# Patient Record
Sex: Male | Born: 1960 | Race: White | Hispanic: No | Marital: Married | Smoking: Current every day smoker
Health system: Southern US, Community
[De-identification: ages and names within clinical notes are randomized; demographics above are authoritative.]

---

## 2020-07-24 DIAGNOSIS — H524 Presbyopia: Secondary | ICD-10-CM | POA: Diagnosis not present

## 2021-09-13 DIAGNOSIS — R5383 Other fatigue: Secondary | ICD-10-CM | POA: Diagnosis not present

## 2021-09-30 ENCOUNTER — Other Ambulatory Visit: Payer: Self-pay | Admitting: Family Medicine

## 2021-09-30 ENCOUNTER — Ambulatory Visit
Admission: RE | Admit: 2021-09-30 | Discharge: 2021-09-30 | Disposition: A | Discharge status: Home / Self Care | Payer: Self-pay | Source: Ambulatory Visit | Attending: Family Medicine | Admitting: Family Medicine

## 2021-09-30 DIAGNOSIS — R61 Generalized hyperhidrosis: Secondary | ICD-10-CM | POA: Diagnosis not present

## 2021-09-30 DIAGNOSIS — R059 Cough, unspecified: Secondary | ICD-10-CM | POA: Diagnosis not present

## 2021-09-30 DIAGNOSIS — R634 Abnormal weight loss: Secondary | ICD-10-CM | POA: Diagnosis not present

## 2021-09-30 DIAGNOSIS — R5383 Other fatigue: Secondary | ICD-10-CM | POA: Diagnosis not present

## 2021-09-30 IMAGING — CR DG CHEST 2V
2 series · 2 of 2 positions shown · non-contrast
Comparison: [DATE]

CLINICAL DATA: 60-year-old male with cough

EXAM:
CHEST - 2 VIEW

[w chest pa]
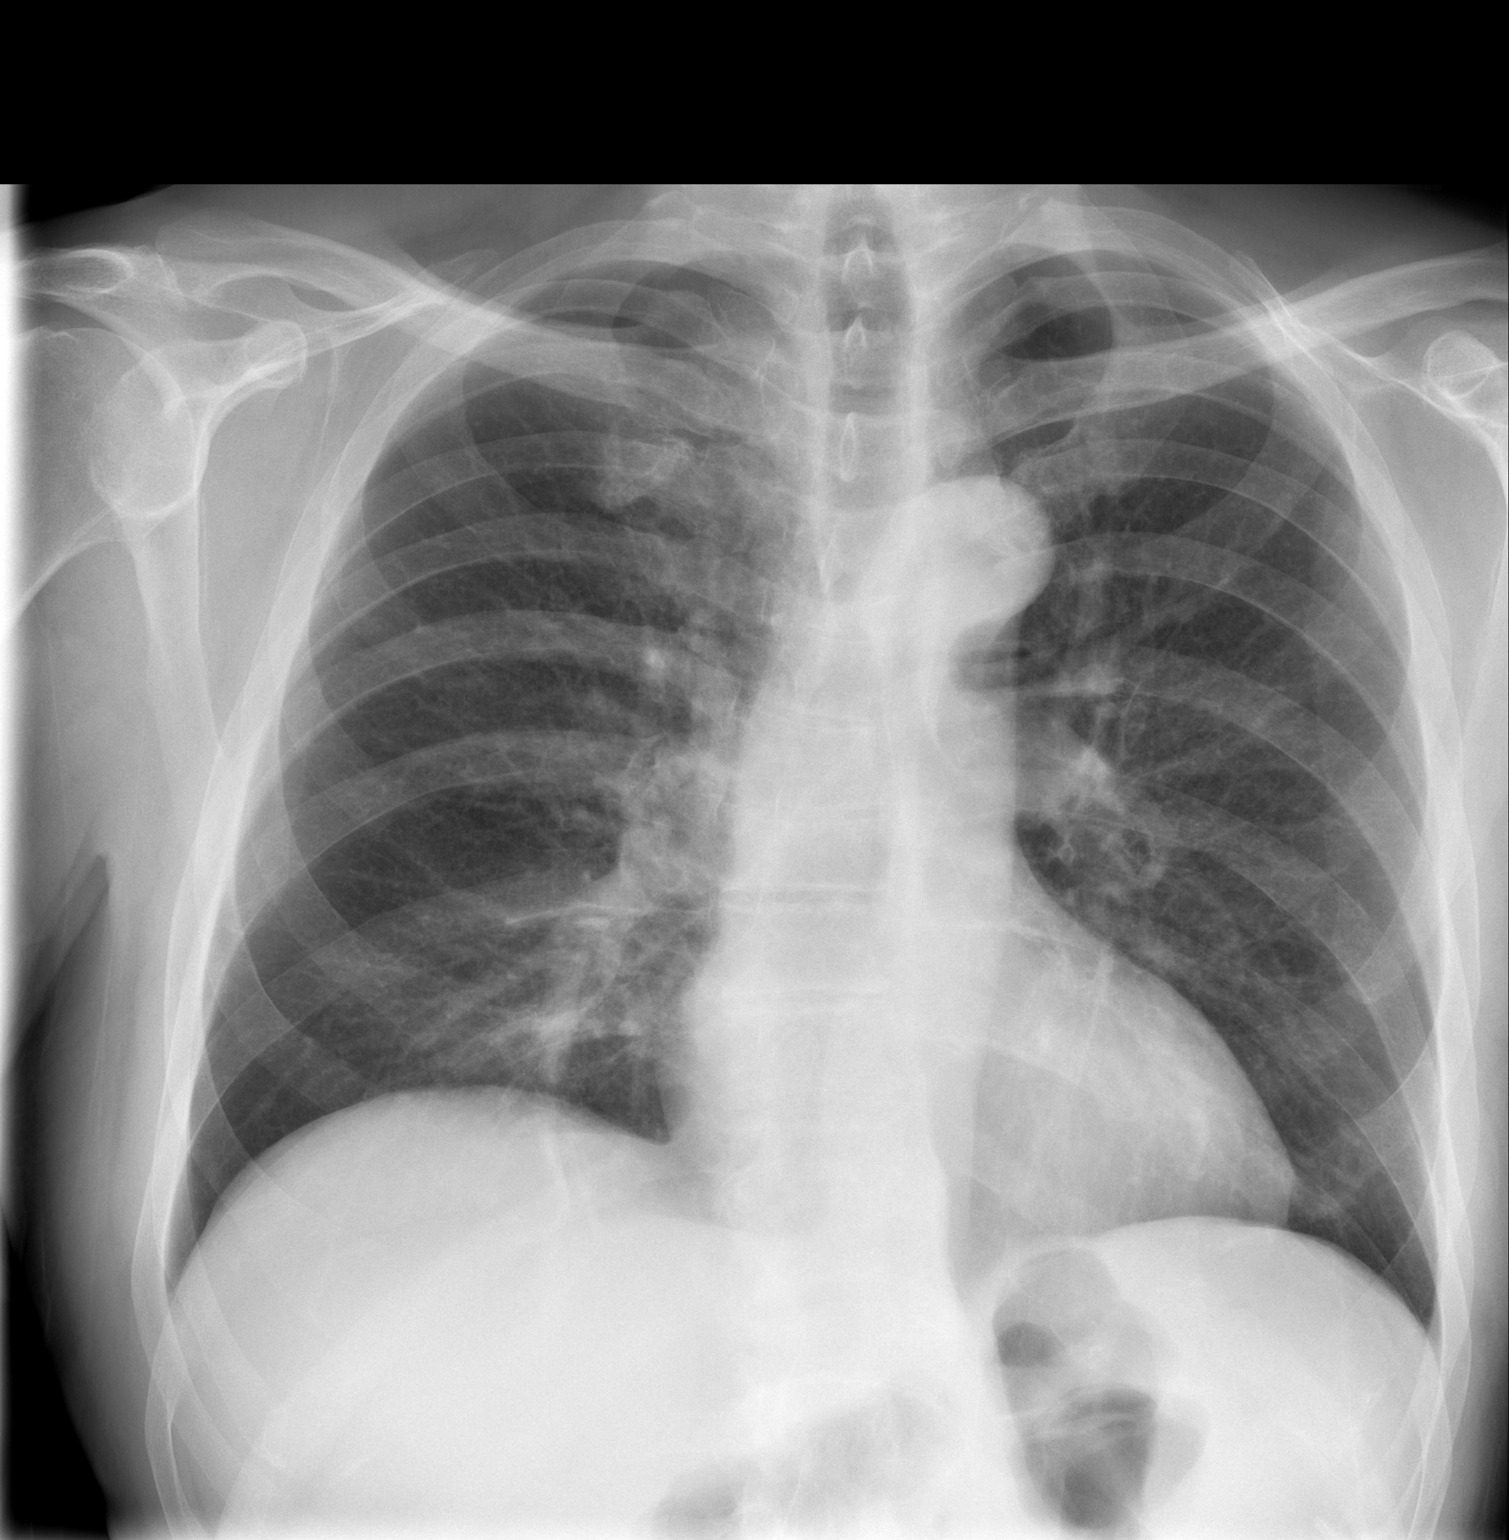

[w chest lat]
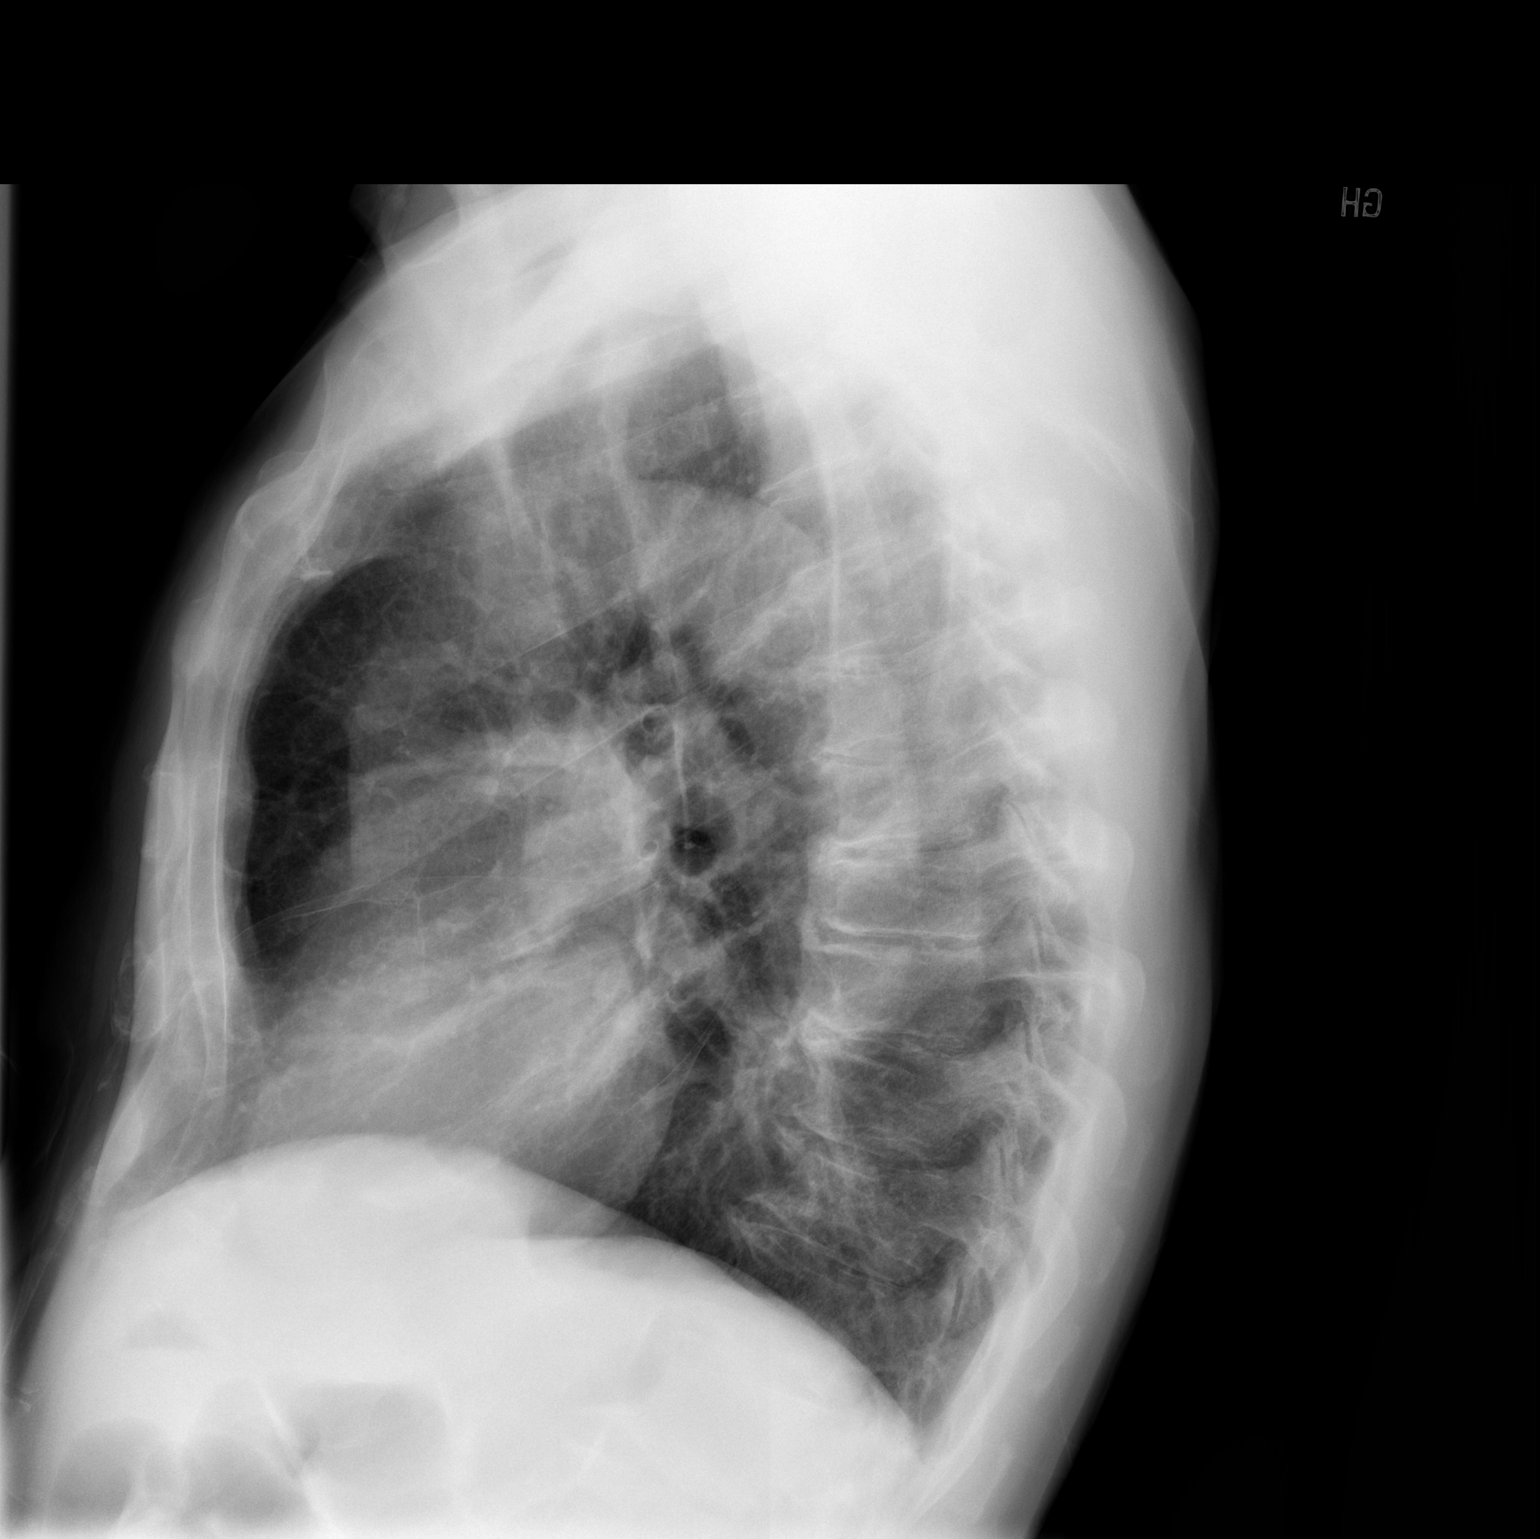

[2 of 2 positions shown; findings below may reference images not displayed]

FINDINGS: Cardiomediastinal silhouette unchanged in size and contour. No
evidence of central vascular congestion. No interlobular septal
thickening.

Mild bronchial wall thickening. Mild reticular opacities in the
infrahilar regions greater on the right.

No pneumothorax or pleural effusion. Coarsened interstitial
markings, with no confluent airspace disease.

No acute displaced fracture. Degenerative changes of the spine.

Scoliotic curvature of the spine.
IMPRESSION: Favored chronic lung changes without evidence of acute
cardiopulmonary disease. Note that low-dose CT lung cancer screening
is recommended for patients who are 55-80 years of age with a 30+
pack-year history of smoking, and who are currently smoking or quit
<=15 years ago.

## 2021-10-05 DIAGNOSIS — N19 Unspecified kidney failure: Secondary | ICD-10-CM | POA: Diagnosis not present

## 2021-10-08 ENCOUNTER — Inpatient Hospital Stay (HOSPITAL_COMMUNITY)
Admission: EM | Admit: 2021-10-08 | Discharge: 2021-10-20 | DRG: 064 | Disposition: E | Payer: BC Managed Care – PPO | Attending: Pulmonary Disease | Admitting: Pulmonary Disease

## 2021-10-08 ENCOUNTER — Emergency Department (HOSPITAL_COMMUNITY): Payer: BC Managed Care – PPO

## 2021-10-08 ENCOUNTER — Encounter (HOSPITAL_COMMUNITY): Payer: Self-pay

## 2021-10-08 ENCOUNTER — Other Ambulatory Visit: Payer: Self-pay | Admitting: Family Medicine

## 2021-10-08 ENCOUNTER — Observation Stay (HOSPITAL_COMMUNITY): Payer: BC Managed Care – PPO

## 2021-10-08 DIAGNOSIS — R29703 NIHSS score 3: Secondary | ICD-10-CM | POA: Diagnosis not present

## 2021-10-08 DIAGNOSIS — N3289 Other specified disorders of bladder: Secondary | ICD-10-CM | POA: Diagnosis not present

## 2021-10-08 DIAGNOSIS — Z20822 Contact with and (suspected) exposure to covid-19: Secondary | ICD-10-CM | POA: Diagnosis not present

## 2021-10-08 DIAGNOSIS — E871 Hypo-osmolality and hyponatremia: Secondary | ICD-10-CM | POA: Diagnosis present

## 2021-10-08 DIAGNOSIS — R319 Hematuria, unspecified: Secondary | ICD-10-CM

## 2021-10-08 DIAGNOSIS — R4182 Altered mental status, unspecified: Secondary | ICD-10-CM | POA: Diagnosis not present

## 2021-10-08 DIAGNOSIS — R4781 Slurred speech: Secondary | ICD-10-CM | POA: Diagnosis present

## 2021-10-08 DIAGNOSIS — R29725 NIHSS score 25: Secondary | ICD-10-CM | POA: Diagnosis not present

## 2021-10-08 DIAGNOSIS — G8191 Hemiplegia, unspecified affecting right dominant side: Secondary | ICD-10-CM | POA: Diagnosis not present

## 2021-10-08 DIAGNOSIS — N138 Other obstructive and reflux uropathy: Secondary | ICD-10-CM | POA: Diagnosis not present

## 2021-10-08 DIAGNOSIS — I63442 Cerebral infarction due to embolism of left cerebellar artery: Principal | ICD-10-CM | POA: Diagnosis present

## 2021-10-08 DIAGNOSIS — D649 Anemia, unspecified: Secondary | ICD-10-CM | POA: Insufficient documentation

## 2021-10-08 DIAGNOSIS — R338 Other retention of urine: Secondary | ICD-10-CM | POA: Diagnosis present

## 2021-10-08 DIAGNOSIS — N2889 Other specified disorders of kidney and ureter: Secondary | ICD-10-CM | POA: Diagnosis not present

## 2021-10-08 DIAGNOSIS — N401 Enlarged prostate with lower urinary tract symptoms: Secondary | ICD-10-CM | POA: Diagnosis present

## 2021-10-08 DIAGNOSIS — R29714 NIHSS score 14: Secondary | ICD-10-CM | POA: Diagnosis not present

## 2021-10-08 DIAGNOSIS — I330 Acute and subacute infective endocarditis: Secondary | ICD-10-CM | POA: Diagnosis not present

## 2021-10-08 DIAGNOSIS — D72829 Elevated white blood cell count, unspecified: Secondary | ICD-10-CM | POA: Diagnosis present

## 2021-10-08 DIAGNOSIS — G9341 Metabolic encephalopathy: Secondary | ICD-10-CM | POA: Diagnosis not present

## 2021-10-08 DIAGNOSIS — F17210 Nicotine dependence, cigarettes, uncomplicated: Secondary | ICD-10-CM | POA: Diagnosis present

## 2021-10-08 DIAGNOSIS — N280 Ischemia and infarction of kidney: Secondary | ICD-10-CM | POA: Diagnosis present

## 2021-10-08 DIAGNOSIS — I639 Cerebral infarction, unspecified: Secondary | ICD-10-CM | POA: Diagnosis not present

## 2021-10-08 DIAGNOSIS — R9431 Abnormal electrocardiogram [ECG] [EKG]: Secondary | ICD-10-CM | POA: Diagnosis not present

## 2021-10-08 DIAGNOSIS — I619 Nontraumatic intracerebral hemorrhage, unspecified: Secondary | ICD-10-CM | POA: Diagnosis not present

## 2021-10-08 DIAGNOSIS — I6340 Cerebral infarction due to embolism of unspecified cerebral artery: Secondary | ICD-10-CM | POA: Diagnosis not present

## 2021-10-08 DIAGNOSIS — G9340 Encephalopathy, unspecified: Secondary | ICD-10-CM | POA: Diagnosis not present

## 2021-10-08 DIAGNOSIS — Z8673 Personal history of transient ischemic attack (TIA), and cerebral infarction without residual deficits: Secondary | ICD-10-CM | POA: Diagnosis not present

## 2021-10-08 DIAGNOSIS — R29818 Other symptoms and signs involving the nervous system: Secondary | ICD-10-CM | POA: Diagnosis not present

## 2021-10-08 DIAGNOSIS — I6389 Other cerebral infarction: Secondary | ICD-10-CM | POA: Diagnosis not present

## 2021-10-08 DIAGNOSIS — Z79899 Other long term (current) drug therapy: Secondary | ICD-10-CM

## 2021-10-08 DIAGNOSIS — Z515 Encounter for palliative care: Secondary | ICD-10-CM

## 2021-10-08 DIAGNOSIS — J9600 Acute respiratory failure, unspecified whether with hypoxia or hypercapnia: Secondary | ICD-10-CM | POA: Diagnosis not present

## 2021-10-08 DIAGNOSIS — D509 Iron deficiency anemia, unspecified: Secondary | ICD-10-CM | POA: Diagnosis present

## 2021-10-08 DIAGNOSIS — R29717 NIHSS score 17: Secondary | ICD-10-CM | POA: Diagnosis not present

## 2021-10-08 DIAGNOSIS — K4020 Bilateral inguinal hernia, without obstruction or gangrene, not specified as recurrent: Secondary | ICD-10-CM | POA: Diagnosis not present

## 2021-10-08 DIAGNOSIS — E785 Hyperlipidemia, unspecified: Secondary | ICD-10-CM | POA: Diagnosis present

## 2021-10-08 DIAGNOSIS — R531 Weakness: Secondary | ICD-10-CM | POA: Diagnosis not present

## 2021-10-08 DIAGNOSIS — Z4682 Encounter for fitting and adjustment of non-vascular catheter: Secondary | ICD-10-CM | POA: Diagnosis not present

## 2021-10-08 DIAGNOSIS — I340 Nonrheumatic mitral (valve) insufficiency: Secondary | ICD-10-CM | POA: Diagnosis not present

## 2021-10-08 DIAGNOSIS — R29738 NIHSS score 38: Secondary | ICD-10-CM | POA: Diagnosis not present

## 2021-10-08 DIAGNOSIS — I249 Acute ischemic heart disease, unspecified: Secondary | ICD-10-CM

## 2021-10-08 DIAGNOSIS — E876 Hypokalemia: Secondary | ICD-10-CM | POA: Diagnosis present

## 2021-10-08 DIAGNOSIS — R3129 Other microscopic hematuria: Secondary | ICD-10-CM | POA: Diagnosis present

## 2021-10-08 DIAGNOSIS — Z960 Presence of urogenital implants: Secondary | ICD-10-CM | POA: Diagnosis not present

## 2021-10-08 DIAGNOSIS — I469 Cardiac arrest, cause unspecified: Secondary | ICD-10-CM | POA: Diagnosis not present

## 2021-10-08 DIAGNOSIS — I609 Nontraumatic subarachnoid hemorrhage, unspecified: Secondary | ICD-10-CM | POA: Diagnosis not present

## 2021-10-08 DIAGNOSIS — R1310 Dysphagia, unspecified: Secondary | ICD-10-CM | POA: Diagnosis not present

## 2021-10-08 DIAGNOSIS — I700 Atherosclerosis of aorta: Secondary | ICD-10-CM | POA: Diagnosis not present

## 2021-10-08 DIAGNOSIS — J9811 Atelectasis: Secondary | ICD-10-CM | POA: Diagnosis not present

## 2021-10-08 DIAGNOSIS — I2510 Atherosclerotic heart disease of native coronary artery without angina pectoris: Secondary | ICD-10-CM | POA: Diagnosis not present

## 2021-10-08 DIAGNOSIS — R634 Abnormal weight loss: Secondary | ICD-10-CM

## 2021-10-08 LAB — CBC
HCT: 30 % — ABNORMAL LOW (ref 39.0–52.0)
Hemoglobin: 9.9 g/dL — ABNORMAL LOW (ref 13.0–17.0)
MCH: 29.8 pg (ref 26.0–34.0)
MCHC: 33 g/dL (ref 30.0–36.0)
MCV: 90.4 fL (ref 80.0–100.0)
Platelets: 143 10*3/uL — ABNORMAL LOW (ref 150–400)
RBC: 3.32 MIL/uL — ABNORMAL LOW (ref 4.22–5.81)
RDW: 13.5 % (ref 11.5–15.5)
WBC: 11.5 10*3/uL — ABNORMAL HIGH (ref 4.0–10.5)
nRBC: 0 % (ref 0.0–0.2)

## 2021-10-08 LAB — DIFFERENTIAL
Abs Immature Granulocytes: 0.14 10*3/uL — ABNORMAL HIGH (ref 0.00–0.07)
Basophils Absolute: 0 10*3/uL (ref 0.0–0.1)
Basophils Relative: 0 %
Eosinophils Absolute: 0 10*3/uL (ref 0.0–0.5)
Eosinophils Relative: 0 %
Immature Granulocytes: 1 %
Lymphocytes Relative: 8 %
Lymphs Abs: 1 10*3/uL (ref 0.7–4.0)
Monocytes Absolute: 0.8 10*3/uL (ref 0.1–1.0)
Monocytes Relative: 7 %
Neutro Abs: 9.7 10*3/uL — ABNORMAL HIGH (ref 1.7–7.7)
Neutrophils Relative %: 84 %

## 2021-10-08 LAB — I-STAT CHEM 8, ED
BUN: 11 mg/dL (ref 8–23)
Calcium, Ion: 1.11 mmol/L — ABNORMAL LOW (ref 1.15–1.40)
Chloride: 98 mmol/L (ref 98–111)
Creatinine, Ser: 0.8 mg/dL (ref 0.61–1.24)
Glucose, Bld: 109 mg/dL — ABNORMAL HIGH (ref 70–99)
HCT: 28 % — ABNORMAL LOW (ref 39.0–52.0)
Hemoglobin: 9.5 g/dL — ABNORMAL LOW (ref 13.0–17.0)
Potassium: 3.5 mmol/L (ref 3.5–5.1)
Sodium: 133 mmol/L — ABNORMAL LOW (ref 135–145)
TCO2: 25 mmol/L (ref 22–32)

## 2021-10-08 LAB — COMPREHENSIVE METABOLIC PANEL
ALT: 17 U/L (ref 0–44)
AST: 19 U/L (ref 15–41)
Albumin: 2.6 g/dL — ABNORMAL LOW (ref 3.5–5.0)
Alkaline Phosphatase: 75 U/L (ref 38–126)
Anion gap: 9 (ref 5–15)
BUN: 12 mg/dL (ref 8–23)
CO2: 23 mmol/L (ref 22–32)
Calcium: 8.3 mg/dL — ABNORMAL LOW (ref 8.9–10.3)
Chloride: 99 mmol/L (ref 98–111)
Creatinine, Ser: 0.9 mg/dL (ref 0.61–1.24)
GFR, Estimated: >60 mL/min (ref >60)
Glucose, Bld: 115 mg/dL — ABNORMAL HIGH (ref 70–99)
Potassium: 3.5 mmol/L (ref 3.5–5.1)
Sodium: 131 mmol/L — ABNORMAL LOW (ref 135–145)
Total Bilirubin: 0.8 mg/dL (ref 0.3–1.2)
Total Protein: 5.6 g/dL — ABNORMAL LOW (ref 6.5–8.1)

## 2021-10-08 LAB — URINALYSIS, ROUTINE W REFLEX MICROSCOPIC
Bacteria, UA: NONE SEEN
Bilirubin Urine: NEGATIVE
Glucose, UA: NEGATIVE mg/dL
Ketones, ur: NEGATIVE mg/dL
Leukocytes,Ua: NEGATIVE
Nitrite: NEGATIVE
Protein, ur: NEGATIVE mg/dL
Specific Gravity, Urine: 1.012 (ref 1.005–1.030)
pH: 6 (ref 5.0–8.0)

## 2021-10-08 LAB — RAPID URINE DRUG SCREEN, HOSP PERFORMED
Amphetamines: NOT DETECTED
Barbiturates: NOT DETECTED
Benzodiazepines: NOT DETECTED
Cocaine: NOT DETECTED
Opiates: NOT DETECTED
Tetrahydrocannabinol: NOT DETECTED

## 2021-10-08 LAB — PROTIME-INR
INR: 1.2 (ref 0.8–1.2)
Prothrombin Time: 14.8 seconds (ref 11.4–15.2)

## 2021-10-08 LAB — ETHANOL: Alcohol, Ethyl (B): <10 mg/dL (ref <10)

## 2021-10-08 LAB — APTT: aPTT: 48 seconds — ABNORMAL HIGH (ref 24–36)

## 2021-10-08 LAB — RESP PANEL BY RT-PCR (FLU A&B, COVID) ARPGX2
Influenza A by PCR: NEGATIVE
Influenza B by PCR: NEGATIVE
SARS Coronavirus 2 by RT PCR: NEGATIVE

## 2021-10-08 LAB — CBG MONITORING, ED: Glucose-Capillary: 113 mg/dL — ABNORMAL HIGH (ref 70–99)

## 2021-10-08 IMAGING — CT CT ABD-PELV W/O CM
2 of 4 series · 15 of 46 positions shown, 17 images · non-contrast
Comparison: None.

CLINICAL DATA: Hematuria, unknown cause

EXAM:
CT ABDOMEN AND PELVIS WITHOUT CONTRAST
TECHNIQUE: Multidetector CT imaging of the abdomen and pelvis was performed
following the standard protocol without IV contrast.

[Series 3: abd/ pelvis 5.0 i30f 2 · axial · 0.77mm/px · z∈[+577,+1042]mm · 12 of 103 slices shown, 14 images]
[im 5/103  soft-tissue]
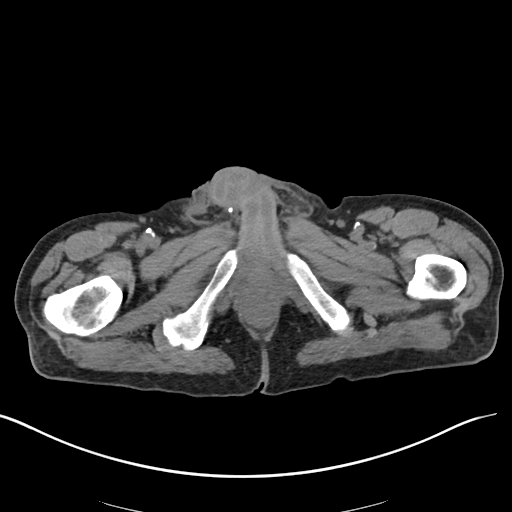
[im 5/103  bone]
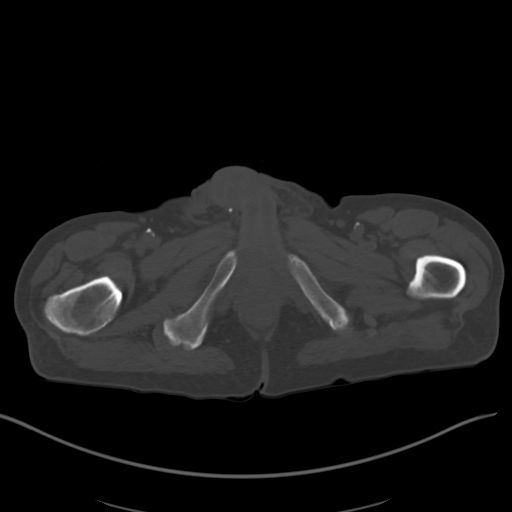
[im 14/103  soft-tissue]
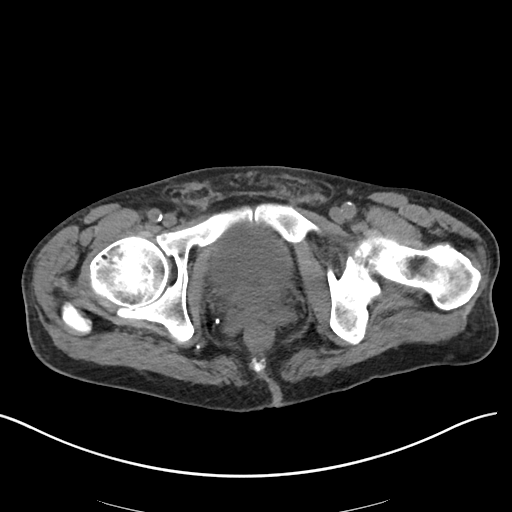
[im 24/103  soft-tissue]
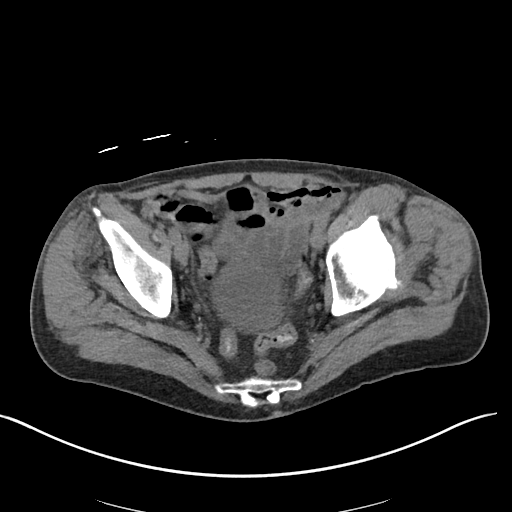
[im 33/103  soft-tissue]
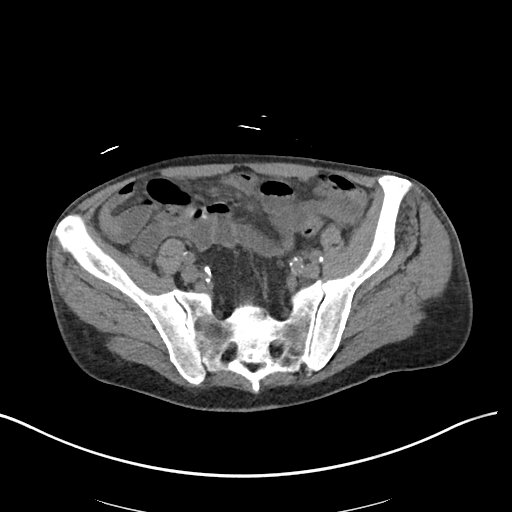
[im 38/103  soft-tissue]
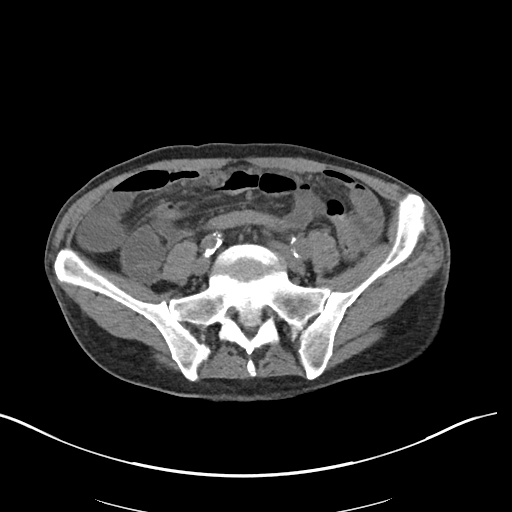
[im 47/103  soft-tissue]
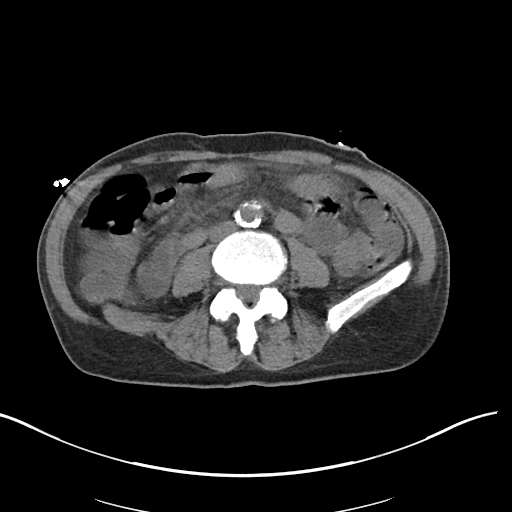
[im 56/103  soft-tissue]
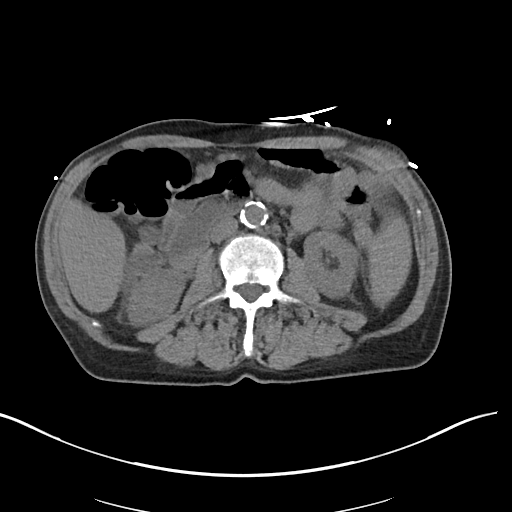
[im 65/103  soft-tissue]
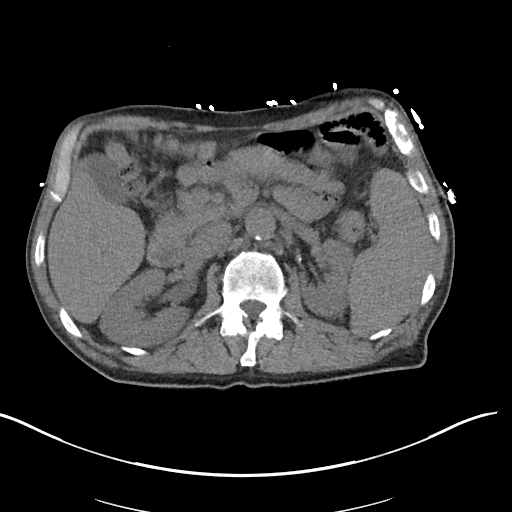
[im 70/103  soft-tissue]
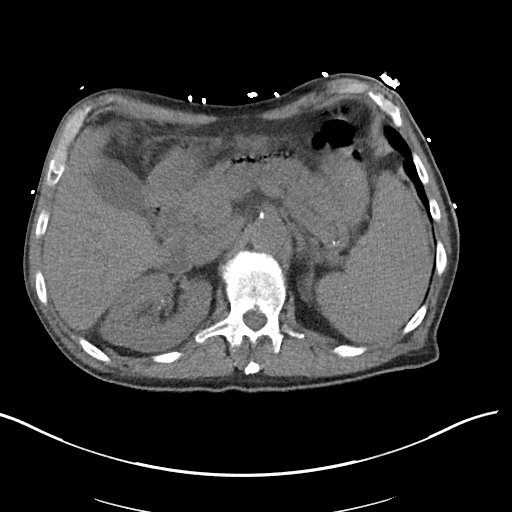
[im 70/103  bone]
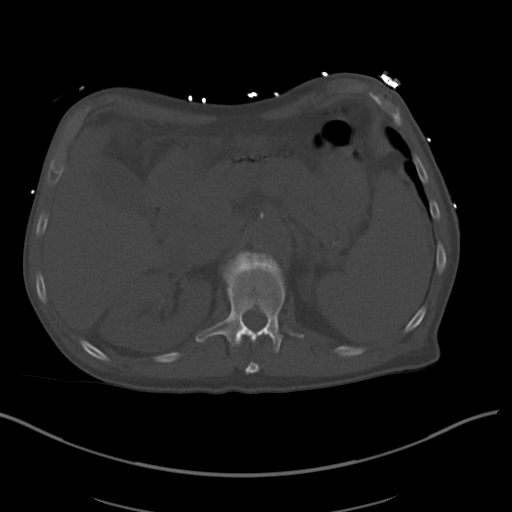
[im 79/103  soft-tissue]
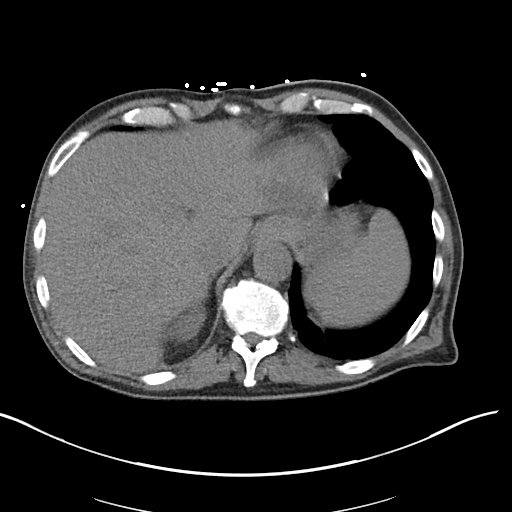
[im 89/103  soft-tissue]
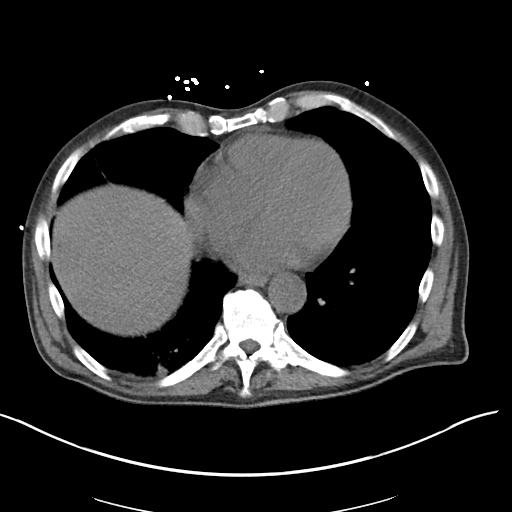
[im 98/103  soft-tissue]
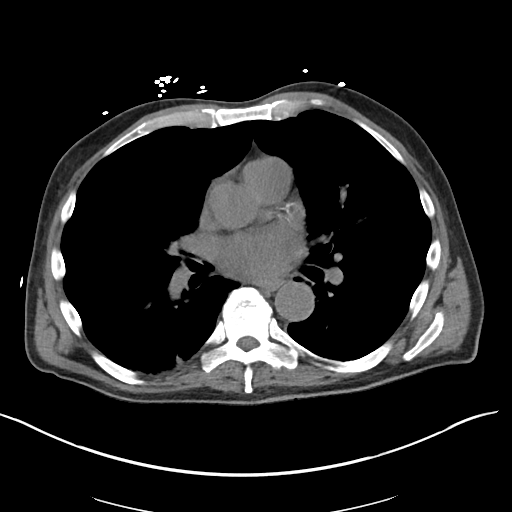

[Series 6: cor st · coronal · 0.73mm/px · 3 of 83 slices shown]
[im 28/83  soft-tissue]
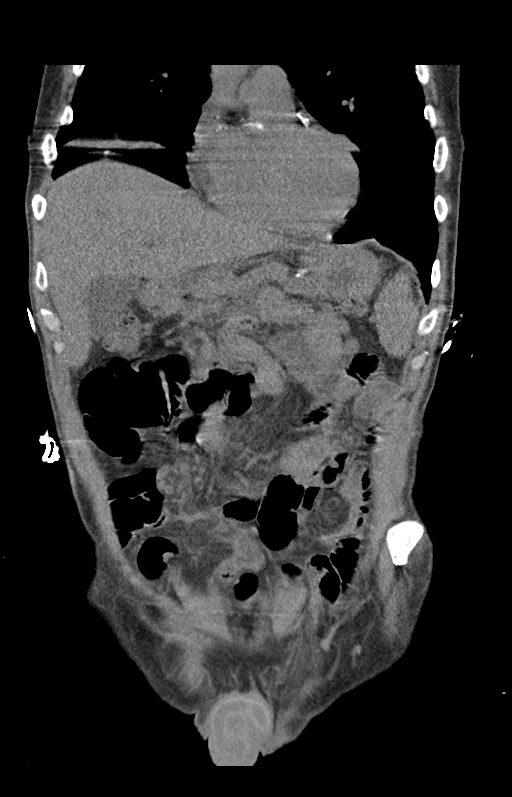
[im 37/83  soft-tissue]
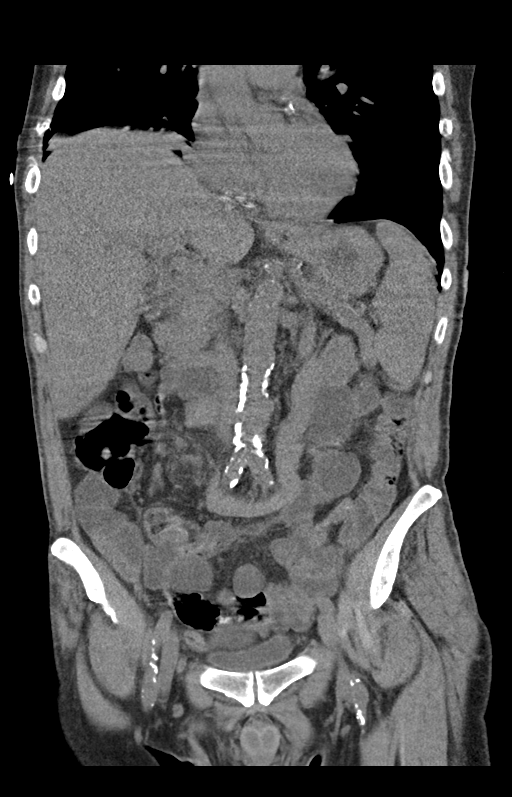
[im 46/83  soft-tissue]
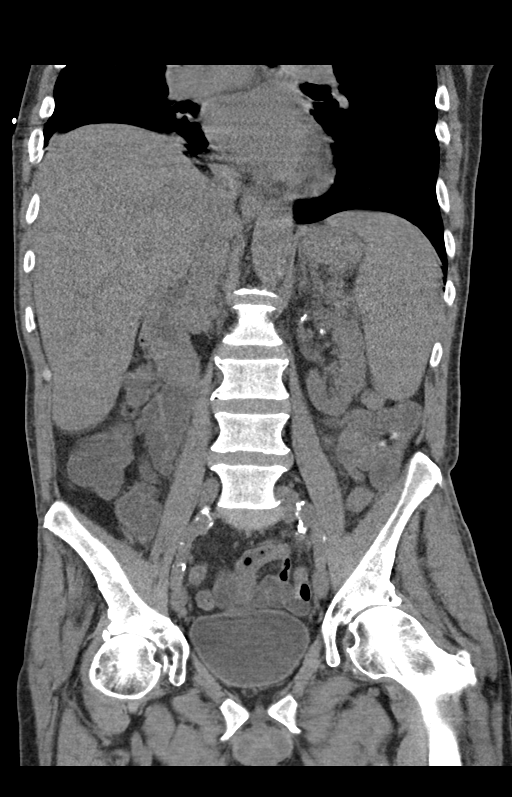

[15 of 46 positions shown; findings below may reference images not displayed]

FINDINGS: Lower chest: Lung bases are clear. Small mucoid material in the
RIGHT mainstem bronchus. Linear pleuroparenchymal thickening at the
RIGHT lung base suggests atelectasis.

Hepatobiliary: No focal hepatic lesion. No biliary duct dilatation.
Common bile duct is normal.

Pancreas: Pancreas is normal. No ductal dilatation. No pancreatic
inflammation.

Spleen: Normal spleen

Adrenals/urinary tract: Adrenal glands are normal. No
nephrolithiasis or ureterolithiasis. No obstructive uropathy.
Vascular calcifications in the renal hila. No bladder calculi.

Stomach/Bowel: Stomach, small-bowel and cecum are normal. The
appendix is not identified but there is no pericecal inflammation to
suggest appendicitis. The colon and rectosigmoid colon are normal.

Vascular/Lymphatic: Abdominal aorta is normal caliber. No periportal
or retroperitoneal adenopathy. No pelvic adenopathy.

Reproductive: Prostate unremarkable

Other: No free fluid.

Musculoskeletal: No aggressive osseous lesion.
IMPRESSION: 1. No nephrolithiasis, ureterolithiasis or obstructive uropathy.
2.  Aortic Atherosclerosis ([JT]-[JT]).

## 2021-10-08 IMAGING — MR MR HEAD W/O CM
6 of 11 series · 24 of 48 positions shown · non-contrast
Comparison: None.

CLINICAL DATA: Neuro deficit, acute, stroke suspected

EXAM:
MRI HEAD WITHOUT CONTRAST
TECHNIQUE: Multiplanar, multiecho pulse sequences of the brain and surrounding
structures were obtained without intravenous contrast.

[Series 2: DWI · axial · 3.0mm · 0.94mm/px · z∈[-76,+79]mm · 7 of 108 slices shown (1 of 2)]
[im 1/108]
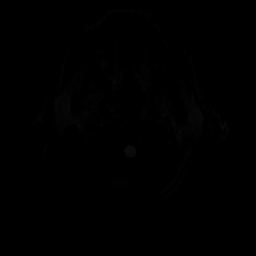
[im 18/108]
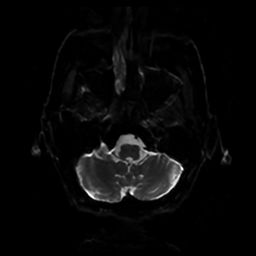
[im 36/108]
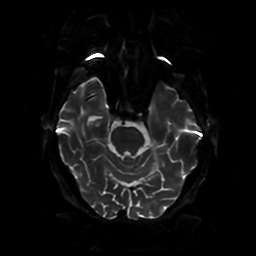
[im 54/108]
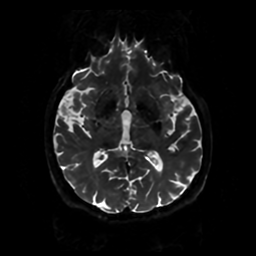
[im 72/108]
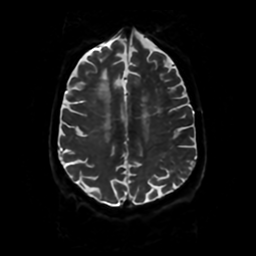
[im 90/108]
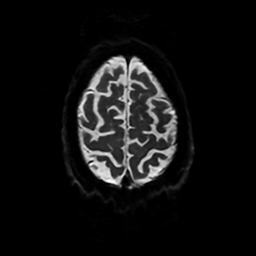
[im 108/108]
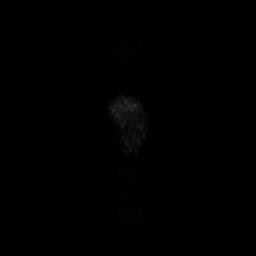

[Series 3: DWI · coronal · 4.0mm · 0.94mm/px · 5 of 76 slices shown (2 of 2)]
[im 1/76]
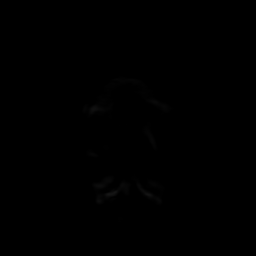
[im 19/76]
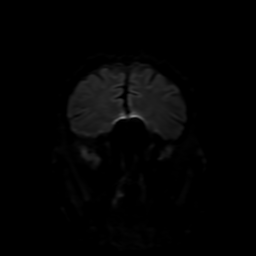
[im 38/76]
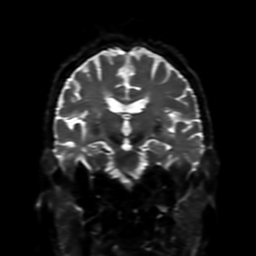
[im 57/76]
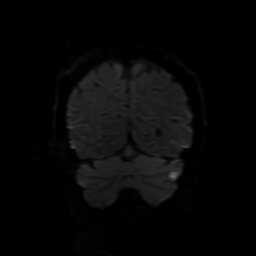
[im 76/76]
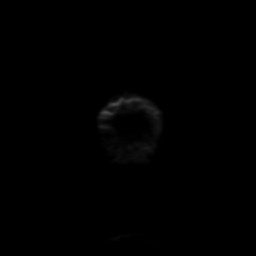

[Series 4: FLAIR · sagittal · 5.0mm · 0.23mm/px · 2 of 28 slices shown (1 of 2)]
[im 1/28]
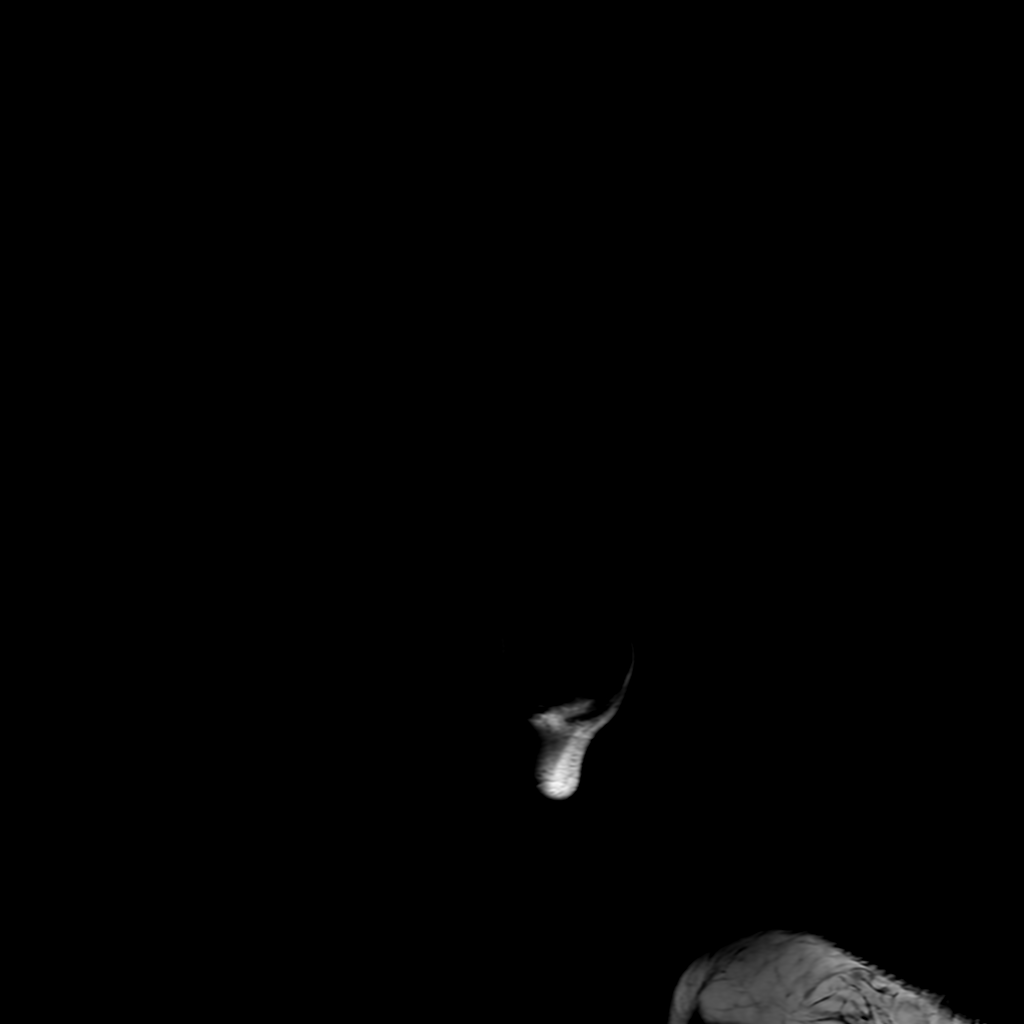
[im 28/28]
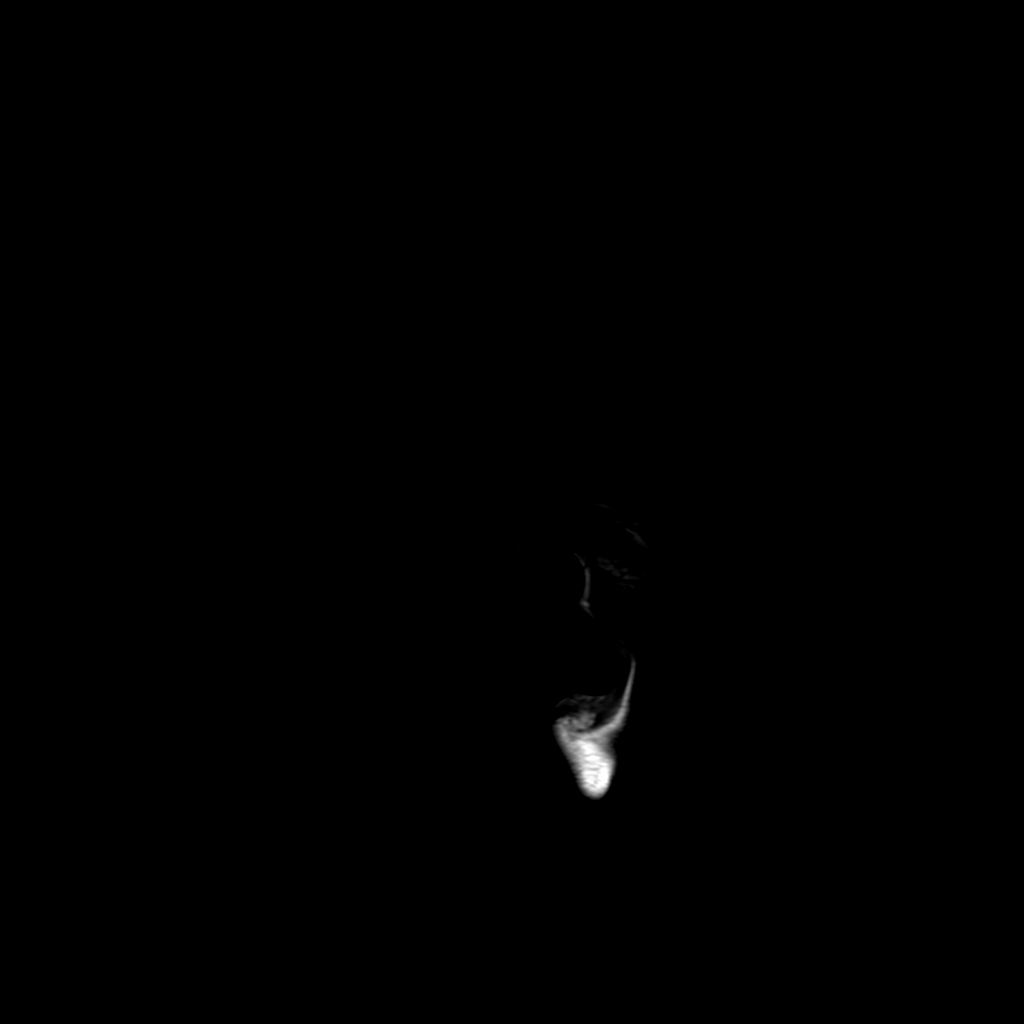

[Series 6: FLAIR · axial · 4.0mm · 0.45mm/px · z∈[-70,+86]mm · 3 of 37 slices shown (2 of 2)]
[im 1/37]
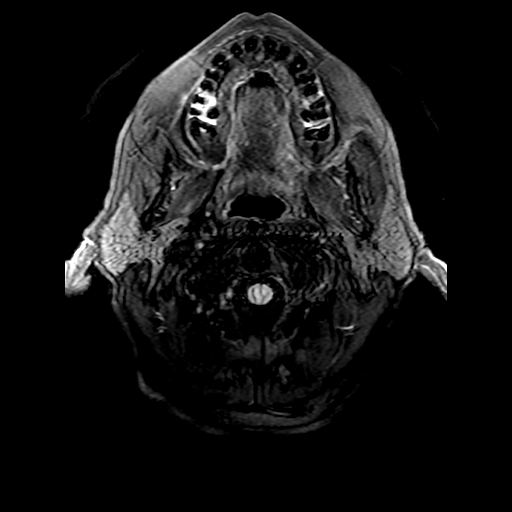
[im 19/37]
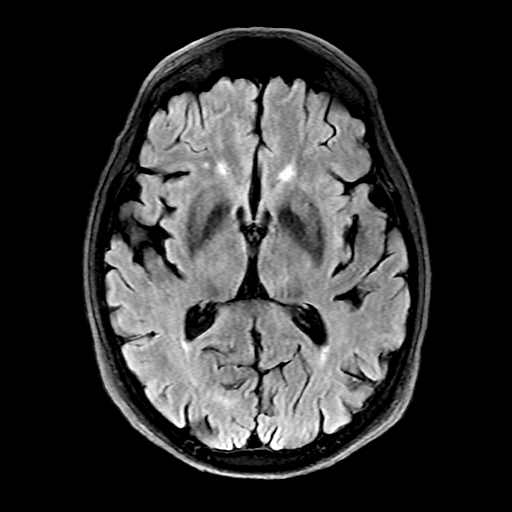
[im 37/37]
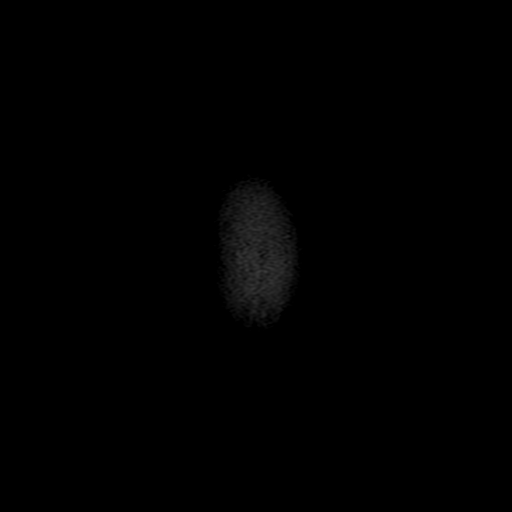

[Series 250: ADC · axial · 3.0mm · 0.94mm/px · z∈[-76,+79]mm · 4 of 51 slices shown (1 of 2)]
[im 1/51]
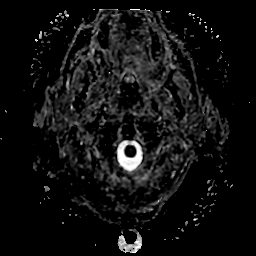
[im 17/51]
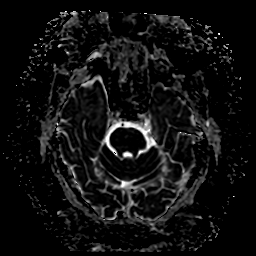
[im 34/51]
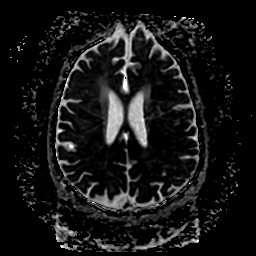
[im 51/51]
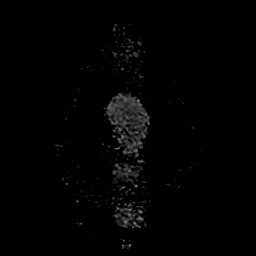

[Series 350: ADC · coronal · 4.0mm · 0.94mm/px · 3 of 35 slices shown (2 of 2)]
[im 1/35]
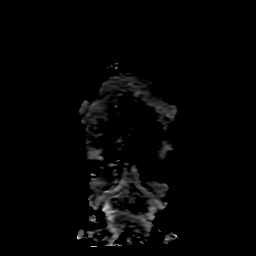
[im 18/35]
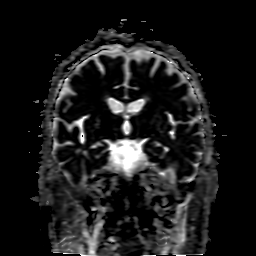
[im 35/35]
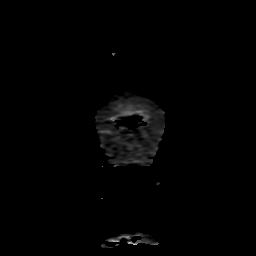

[24 of 48 positions shown; findings below may reference images not displayed]

FINDINGS: Brain: There are scattered small foci of restricted diffusion within
the cerebral hemispheres bilaterally involving frontoparietal lobes
as well as the left insula. Small focus of restricted diffusion
within the peripheral left cerebellum.

Chronic infarcts of the anterior right frontal white matter.
Additional patchy foci of T2 hyperintensity in the supratentorial
white matter are nonspecific but may reflect mild chronic
microvascular ischemic changes. There are curvilinear foci of sulcal
susceptibility likely reflecting sequelae of prior subarachnoid
hemorrhage.

There is no intracranial mass or mass effect. There is no
hydrocephalus or extra-axial fluid collection.

Vascular: Major vessel flow voids at the skull base are preserved.

Skull and upper cervical spine: Normal marrow signal is preserved.

Sinuses/Orbits: Minor mucosal thickening.  Orbits are unremarkable.

Other: Sella is unremarkable.  Mastoid air cells are clear.
IMPRESSION: Small acute infarcts of bilateral cerebral hemispheres and left
cerebellum.

Chronic microvascular ischemic changes and additional chronic
infarcts. Evidence of prior subarachnoid hemorrhage.

## 2021-10-08 IMAGING — DX DG CHEST 1V PORT
1 series · 1 of 1 positions shown · non-contrast
Comparison: [DATE]

CLINICAL DATA: Hematuria, weakness

EXAM:
PORTABLE CHEST 1 VIEW

[chest]
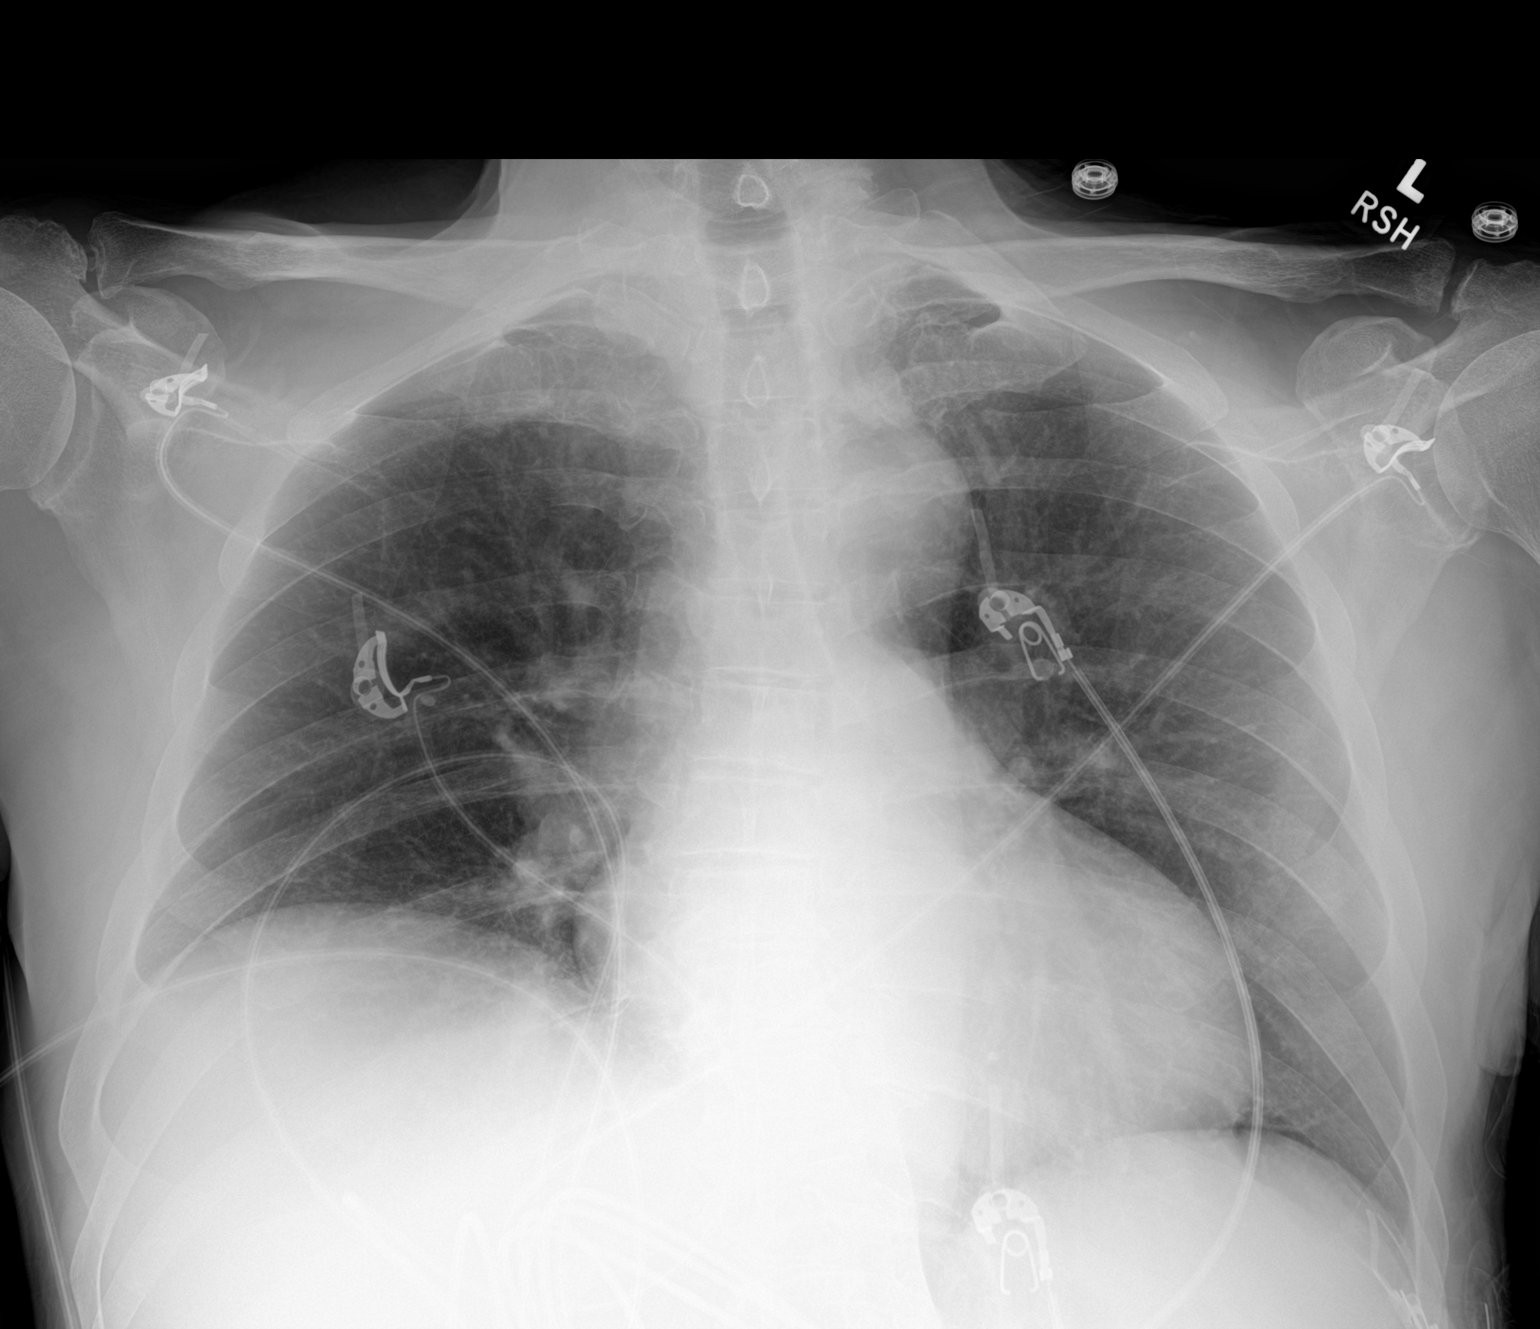

[1 of 1 positions shown; findings below may reference images not displayed]

FINDINGS: Stable cardiomediastinal contours. Slightly decreased lung volumes.
No focal airspace consolidation, pleural effusion, or pneumothorax.
IMPRESSION: No active disease.

## 2021-10-08 IMAGING — MR MR MRA NECK WO/W CM
4 of 7 series · 20 of 48 positions shown · IV contrast (gadavist)
Comparison: None.

CLINICAL DATA: Neuro deficit, acute, stroke suspected

EXAM:
MRA NECK WITHOUT AND WITH CONTRAST
TECHNIQUE: Multiplanar and multiecho pulse sequences of the neck were obtained
without and with intravenous contrast. Angiographic images of the
neck were obtained using MRA technique without and with intravenous
contrast.
CONTRAST:  7.5mL GADAVIST GADOBUTROL 1 MMOL/ML IV SOLN

[Series 600: cor cemra ft · coronal · 1.2mm · 0.59mm/px · 7 of 105 slices shown]
[im 1/105]
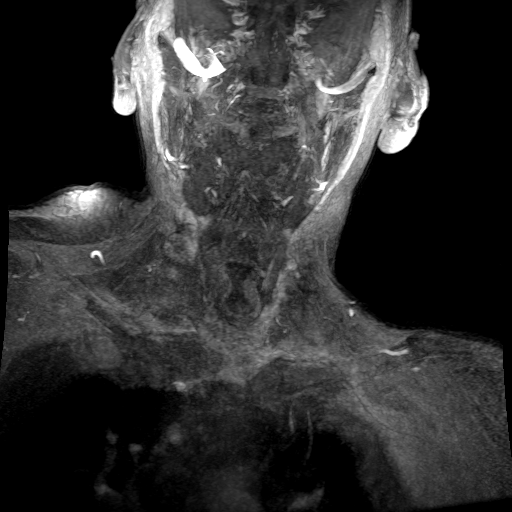
[im 18/105]
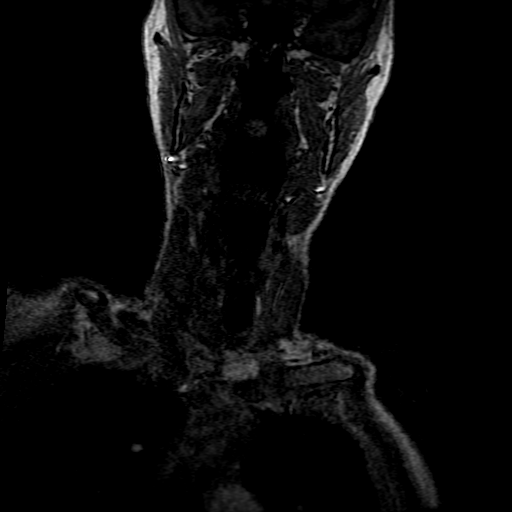
[im 35/105]
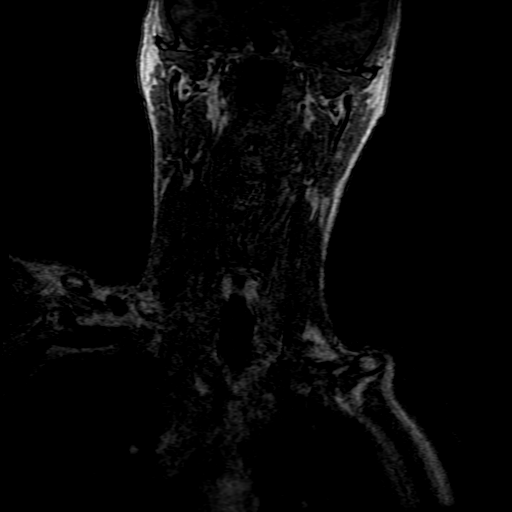
[im 53/105]
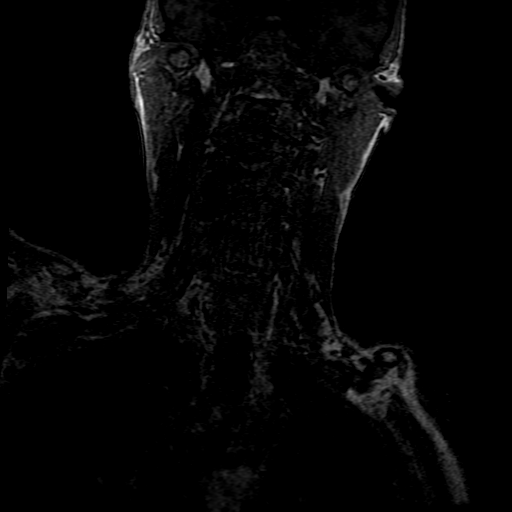
[im 70/105]
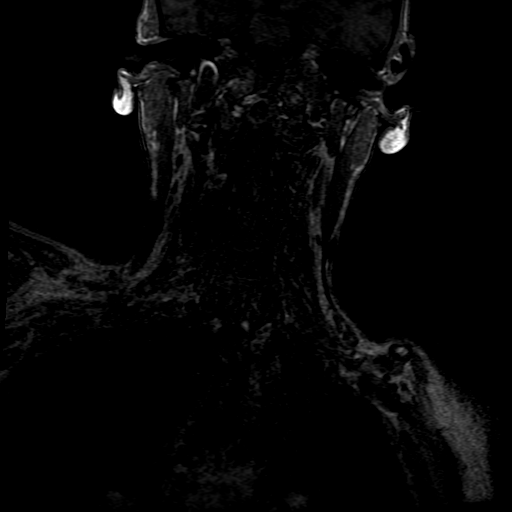
[im 87/105]
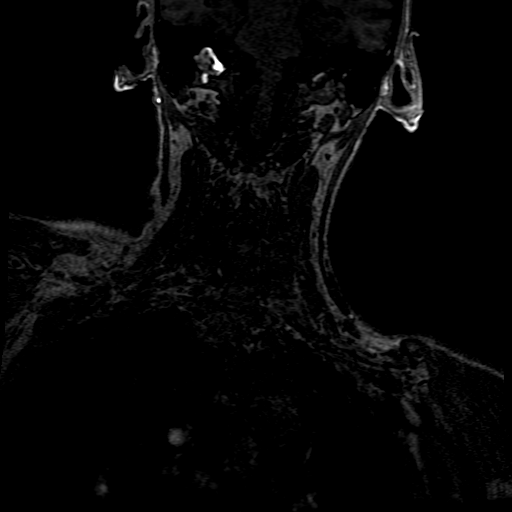
[im 105/105]
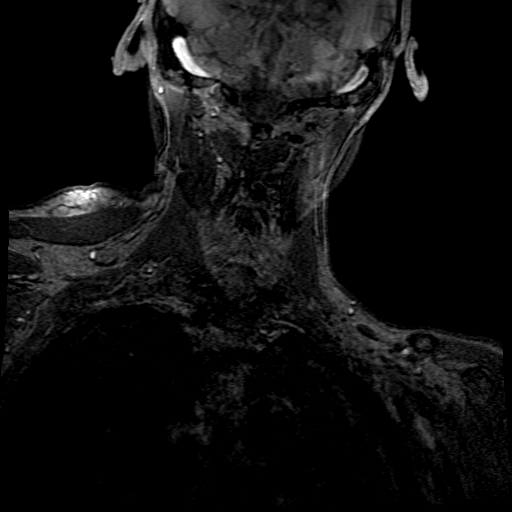

[Series 601: ph1/cor cemra ft · coronal · 1.2mm · 0.59mm/px · 7 of 105 slices shown]
[im 1/105]
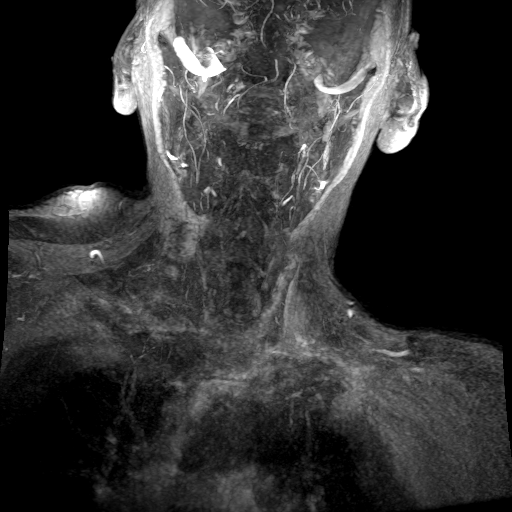
[im 18/105]
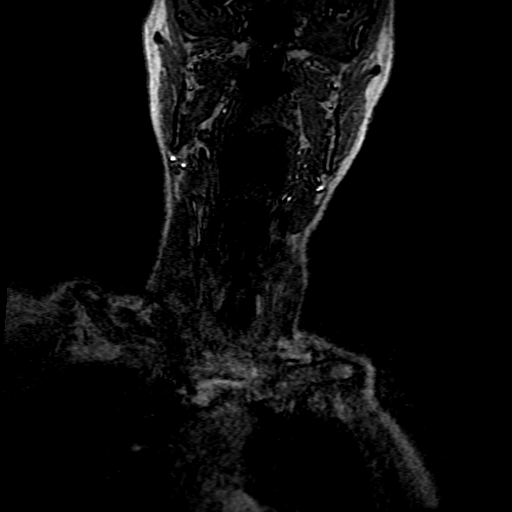
[im 35/105]
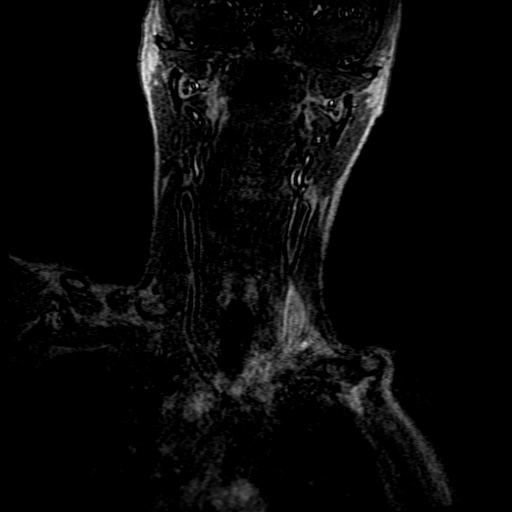
[im 53/105]
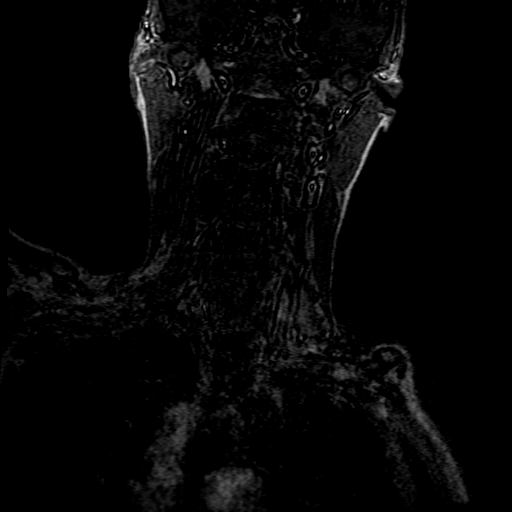
[im 70/105]
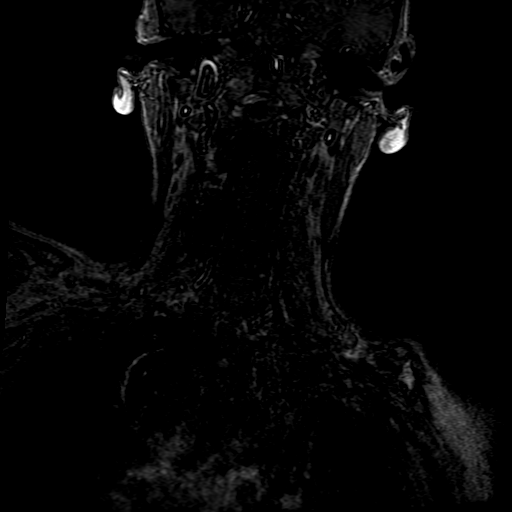
[im 87/105]
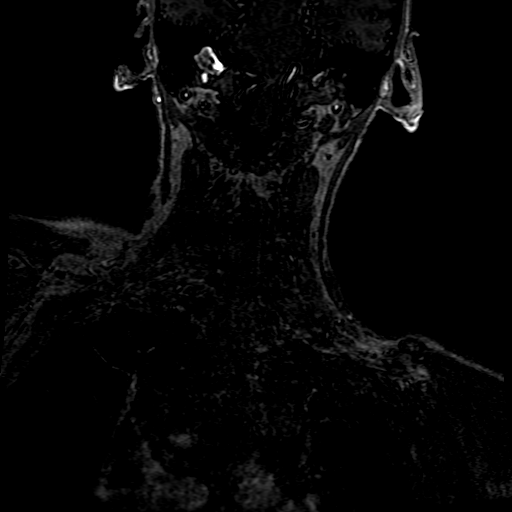
[im 105/105]
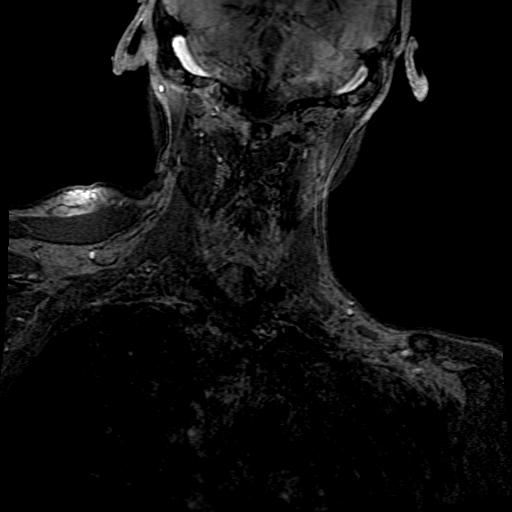

[Series 602: ph2/cor cemra ft · coronal · 1.2mm · 0.59mm/px · 3 of 104 slices shown]
[im 18/104]
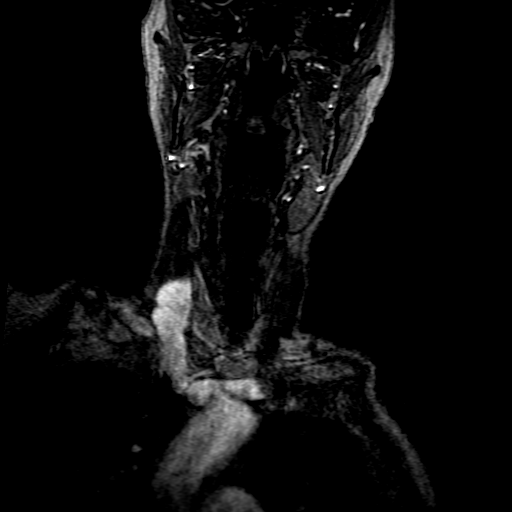
[im 52/104]
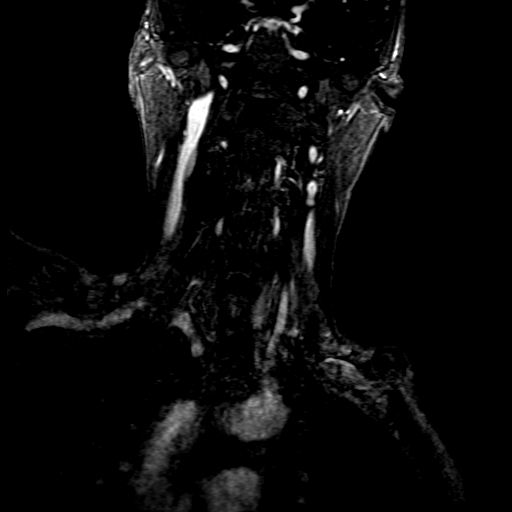
[im 86/104]
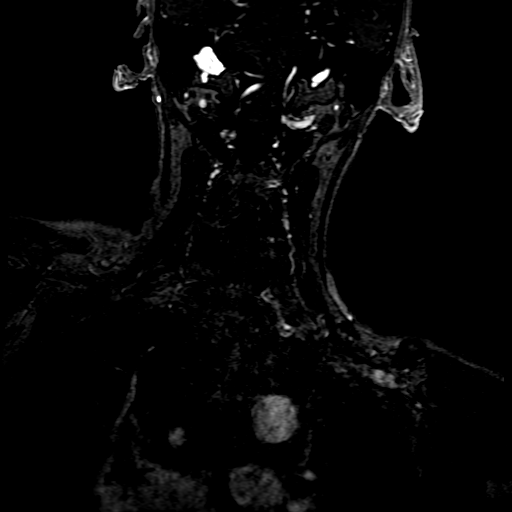

[((date))-((date)) · coronal · 1.2mm · 0.59mm/px · 3 of 105 slices shown]
[im 18/105]
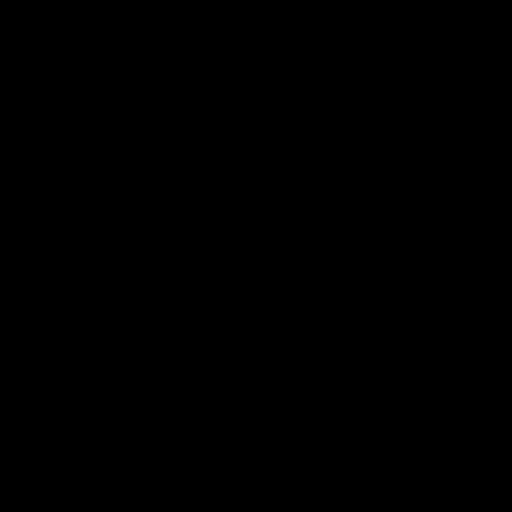
[im 53/105]
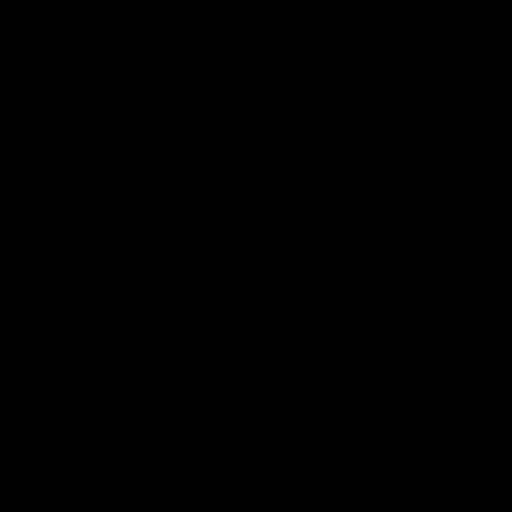
[im 87/105]
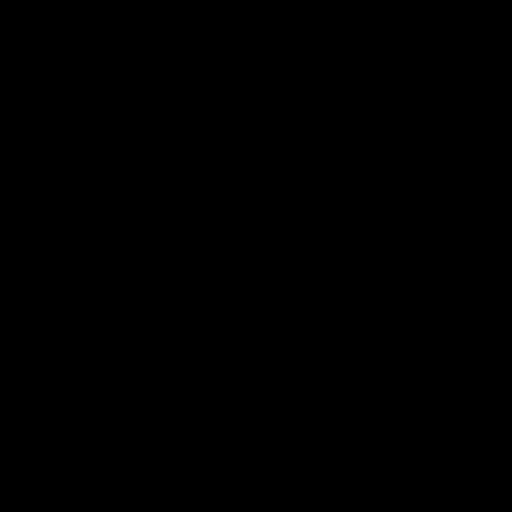

[20 of 48 positions shown; findings below may reference images not displayed]

FINDINGS: Great vessel origins are patent. Common, internal, and external
carotid arteries are patent. There is no significant stenosis.

Codominant extracranial vertebral arteries are patent.  No stenosis.
IMPRESSION: Patent carotid and vertebral arteries without stenosis.

## 2021-10-08 MED ORDER — FERROUS SULFATE 325 (65 FE) MG PO TABS
325.0000 mg | ORAL_TABLET | Freq: Every day | ORAL | Status: DC
  Administered 2021-10-09: 325 mg via ORAL
  Filled 2021-10-08: qty 1

## 2021-10-08 MED ORDER — ACETAMINOPHEN 650 MG RE SUPP
650.0000 mg | RECTAL | Status: DC | PRN
  Administered 2021-10-09: 650 mg via RECTAL

## 2021-10-08 MED ORDER — ACETAMINOPHEN 325 MG PO TABS: 650.0000 mg | ORAL_TABLET | ORAL | Status: DC | PRN

## 2021-10-08 MED ORDER — SODIUM CHLORIDE 0.9 % IV BOLUS
500.0000 mL | Freq: Once | INTRAVENOUS | Status: AC
  Administered 2021-10-08: 500 mL via INTRAVENOUS

## 2021-10-08 MED ORDER — ASPIRIN-ACETAMINOPHEN-CAFFEINE 250-250-65 MG PO TABS
1.0000 | ORAL_TABLET | Freq: Four times a day (QID) | ORAL | Status: DC | PRN
  Filled 2021-10-08: qty 1

## 2021-10-08 MED ORDER — ACETAMINOPHEN 160 MG/5ML PO SOLN: 650.0000 mg | ORAL | Status: DC | PRN

## 2021-10-08 MED ORDER — SODIUM CHLORIDE 0.9 % IV SOLN
100.0000 mL/h | INTRAVENOUS | Status: DC
  Administered 2021-10-08 (×2): 100 mL/h via INTRAVENOUS

## 2021-10-08 MED ORDER — ASPIRIN EC 81 MG PO TBEC
81.0000 mg | DELAYED_RELEASE_TABLET | Freq: Every day | ORAL | Status: DC
  Administered 2021-10-08 – 2021-10-09 (×2): 81 mg via ORAL
  Filled 2021-10-08 (×3): qty 1

## 2021-10-08 MED ORDER — STROKE: EARLY STAGES OF RECOVERY BOOK
Freq: Once | Status: DC
  Filled 2021-10-08: qty 1

## 2021-10-08 MED ORDER — GADOBUTROL 1 MMOL/ML IV SOLN
7.5000 mL | Freq: Once | INTRAVENOUS | Status: AC | PRN
  Administered 2021-10-08: 7.5 mL via INTRAVENOUS

## 2021-10-08 MED ORDER — SENNOSIDES-DOCUSATE SODIUM 8.6-50 MG PO TABS
1.0000 | ORAL_TABLET | Freq: Every evening | ORAL | Status: DC | PRN
Start: 2021-10-08 — End: 2021-10-10

## 2021-10-08 NOTE — H&P (Signed)
History and Physical    Hershal Eriksson JHE:[id number] DOB: 06-09-1961 DOA: 09/20/2021  PCP: Irven Coe, MD (Confirm with patient/family/NH records and if not entered, this has to be entered at [facility] point of entry) Patient coming from: Home  I have personally briefly reviewed patient's old medical records in [facility]  Chief Complaint: I feel fine  HPI: Lance Spears is a 60 y.o. male with medical history significant of BPH and urinary retention on indwelling Foley catheter, chronic iron deficiency anemia on iron supplement, hematuria, presented with generalized weakness.  Patient complained to family about fatigue and generalized weakness this morning, wife noticed patient has hard time to grab with right hand, same time also has had slurred speech.  Patient however has no major complaints, saying his strength on both arms is good, no numbness on any other limbs.  No headache no blurry vision.  Patient was recently found to have microscopic hematuria and referred to see urology.  And a Foley catheter was inserted at PCPs office on 10/16th.  ED Course: Vital signs stable, hemoglobin 9.9.  CT head showed right anterior frontal white matter age indeterminant stroke.  Review of Systems: As per HPI otherwise 14 point review of systems negative.    History reviewed. No pertinent past medical history.     reports that he has been smoking cigarettes. He has been smoking an average of 1 pack per day. He does not have any smokeless tobacco history on file. He reports that he does not drink alcohol and does not use drugs.  No Known Allergies  No family history on file.   Prior to Admission medications   Medication Sig Start Date End Date Taking? Authorizing Provider  aspirin-acetaminophen-caffeine (EXCEDRIN MIGRAINE) [phone] MG tablet Take 1 tablet by mouth every 6 (six) hours as needed for headache.   Yes [provider]  Ferrous Sulfate (IRON PO) Take 1 tablet by mouth  daily.   Yes [provider]    Physical Exam: Vitals:   09/29/2021 1245 10/03/2021 1330 09/28/2021 1430 10/13/2021 1500  BP: 131/82 131/77 126/77 130/84  Pulse: 99 89 92 95  Resp: (!) 26 (!) 25 (!) 25 (!) 29  Temp:      TempSrc:      SpO2: 98% 98% 97% 97%    Constitutional: NAD, calm, comfortable Vitals:   10/17/2021 1245 09/27/2021 1330 10/03/2021 1430 10/19/2021 1500  BP: 131/82 131/77 126/77 130/84  Pulse: 99 89 92 95  Resp: (!) 26 (!) 25 (!) 25 (!) 29  Temp:      TempSrc:      SpO2: 98% 98% 97% 97%   Eyes: PERRL, lids and conjunctivae normal ENMT: Mucous membranes are moist. Posterior pharynx clear of any exudate or lesions.Normal dentition.  Neck: normal, supple, no masses, no thyromegaly Respiratory: clear to auscultation bilaterally, no wheezing, no crackles. Normal respiratory effort. No accessory muscle use.  Cardiovascular: Regular rate and rhythm, no murmurs / rubs / gallops. No extremity edema. 2+ pedal pulses. No carotid bruits.  Abdomen: no tenderness, no masses palpated. No hepatosplenomegaly. Bowel sounds positive.  Musculoskeletal: no clubbing / cyanosis. No joint deformity upper and lower extremities. Good ROM, no contractures. Normal muscle tone.  Skin: no rashes, lesions, ulcers. No induration Neurologic: CN 2-12 grossly intact. Sensation intact, DTR normal. Strength 5/5 in all 4.  Psychiatric: Normal judgment and insight. Alert and oriented x 3. Normal mood.     Labs on Admission: I have personally reviewed following labs and imaging  studies  CBC: Recent Labs  Lab 11/05/2021 1241 11-05-2021 1338  WBC 11.5*  --   NEUTROABS 9.7*  --   HGB 9.9* 9.5*  HCT 30.0* 28.0*  MCV 90.4  --   PLT 143*  --    Basic Metabolic Panel: Recent Labs  Lab 11-05-2021 1241 11/05/21 1338  NA 131* 133*  K 3.5 3.5  CL 99 98  CO2 23  --   GLUCOSE 115* 109*  BUN 12 11  CREATININE 0.90 0.80  CALCIUM 8.3*  --    GFR: CrCl cannot be calculated (Unknown ideal  weight.). Liver Function Tests: Recent Labs  Lab November 05, 2021 1241  AST 19  ALT 17  ALKPHOS 75  BILITOT 0.8  PROT 5.6*  ALBUMIN 2.6*   No results for input(s): LIPASE, AMYLASE in the last 168 hours. No results for input(s): AMMONIA in the last 168 hours. Coagulation Profile: Recent Labs  Lab Nov 05, 2021 1241  INR 1.2   Cardiac Enzymes: No results for input(s): CKTOTAL, CKMB, CKMBINDEX, TROPONINI in the last 168 hours. BNP (last 3 results) No results for input(s): PROBNP in the last 8760 hours. HbA1C: No results for input(s): HGBA1C in the last 72 hours. CBG: Recent Labs  Lab November 05, 2021 1225  GLUCAP 113*   Lipid Profile: No results for input(s): CHOL, HDL, LDLCALC, TRIG, CHOLHDL, LDLDIRECT in the last 72 hours. Thyroid Function Tests: No results for input(s): TSH, T4TOTAL, FREET4, T3FREE, THYROIDAB in the last 72 hours. Anemia Panel: No results for input(s): VITAMINB12, FOLATE, FERRITIN, TIBC, IRON, RETICCTPCT in the last 72 hours. Urine analysis: No results found for: COLORURINE, APPEARANCEUR, LABSPEC, PHURINE, GLUCOSEU, HGBUR, BILIRUBINUR, KETONESUR, PROTEINUR, UROBILINOGEN, NITRITE, LEUKOCYTESUR  Radiological Exams on Admission: CT ABDOMEN PELVIS WO CONTRAST  Result Date: 05-Nov-2021 CLINICAL DATA:  Hematuria, unknown cause EXAM: CT ABDOMEN AND PELVIS WITHOUT CONTRAST TECHNIQUE: Multidetector CT imaging of the abdomen and pelvis was performed following the standard protocol without IV contrast. COMPARISON:  None. FINDINGS: Lower chest: Lung bases are clear. Small mucoid material in the RIGHT mainstem bronchus. Linear pleuroparenchymal thickening at the RIGHT lung base suggests atelectasis. Hepatobiliary: No focal hepatic lesion. No biliary duct dilatation. Common bile duct is normal. Pancreas: Pancreas is normal. No ductal dilatation. No pancreatic inflammation. Spleen: Normal spleen Adrenals/urinary tract: Adrenal glands are normal. No nephrolithiasis or ureterolithiasis. No  obstructive uropathy. Vascular calcifications in the renal hila. No bladder calculi. Stomach/Bowel: Stomach, small-bowel and cecum are normal. The appendix is not identified but there is no pericecal inflammation to suggest appendicitis. The colon and rectosigmoid colon are normal. Vascular/Lymphatic: Abdominal aorta is normal caliber. No periportal or retroperitoneal adenopathy. No pelvic adenopathy. Reproductive: Prostate unremarkable Other: No free fluid. Musculoskeletal: No aggressive osseous lesion. IMPRESSION: 1. No nephrolithiasis, ureterolithiasis or obstructive uropathy. 2.  Aortic Atherosclerosis (ICD10-I70.0). Electronically Signed   By: Genevive Bi M.D.   On: 11/05/2021 14:26   CT HEAD WO CONTRAST  Result Date: 11/05/2021 CLINICAL DATA:  Neuro deficit, acute, stroke suspected EXAM: CT HEAD WITHOUT CONTRAST TECHNIQUE: Contiguous axial images were obtained from the base of the skull through the vertex without intravenous contrast. COMPARISON:  None. FINDINGS: Brain: Asymmetric hypodensities within the anterior right frontal white matter, concerning for age indeterminate infarct. Additional patchy white matter hypodensities are nonspecific but compatible with mild chronic microvascular ischemic disease. No evidence of acute hemorrhage, mass lesion, abnormal mass effect, or hydrocephalus. Vascular: No hyperdense vessel identified. Calcific intracranial atherosclerosis. Skull: No acute fracture. Sinuses/Orbits: Visualized sinuses are largely clear. Partially imaged mucosal thickening  in the inferior left maxillary sinus. Unremarkable orbits. Other: No mastoid effusions.  The IMPRESSION: Findings concerning for age indeterminate infarct in the anterior right frontal white matter, potentially acute or subacute. Recommend MRI to further evaluate. Electronically Signed   By: Feliberto Harts M.D.   On: 10/07/2021 14:21   DG Chest Portable 1 View  Result Date: 10/12/2021 CLINICAL DATA:  Hematuria,  weakness EXAM: PORTABLE CHEST 1 VIEW COMPARISON:  09/30/2021 FINDINGS: Stable cardiomediastinal contours. Slightly decreased lung volumes. No focal airspace consolidation, pleural effusion, or pneumothorax. IMPRESSION: No active disease. Electronically Signed   By: Duanne Guess D.O.   On: 10/06/2021 13:37    EKG: Independently reviewed.  Sinus rhythm, no PR or QTC changes.  Assessment/Plan Active Problems:   Stroke (cerebrum) (HCC)   CVA (cerebral vascular accident) (HCC)  (please populate well all problems here in Problem List. (For example, if patient is on BP meds at home and you resume or decide to hold them, it is a problem that needs to be her. Same for CAD, COPD, HLD and so on)  Stroke -Initial symptoms of right-sided paresis and aphasia all improved. -Right anterior frontal lobe, MRI pending. -ASA -Tele x 24 hours, Echo  BPH and chronic urinary obstruction with indwelling Foley catheter -Foley exchanged today, outpatient follow-up with urology  Chronic iron deficiency anemia -Doubt this is solely due to hematuria, refer to outpatient GI  Microscopic hematuria -Outpatient urology follow-up.  DVT prophylaxis: SCD Code Status: Full code Family Communication: Wife over the phone Disposition Plan: Expect less than 2 midnight hospital stay Consults called: Neuro Admission status: Tele obs   Emeline General MD [facility] Pager [phone]  10/17/2021, 3:48 PM

## 2021-10-08 NOTE — ED Notes (Signed)
ED Provider at bedside. 

## 2021-10-08 NOTE — ED Provider Notes (Signed)
[facility] EMERGENCY DEPARTMENT Provider Note   CSN: [id number] Arrival date & time: 10-29-2021  1216     History Chief Complaint  Patient presents with   Hematuria    Lance Spears is a 60 y.o. male.  HPI  Patient has been having issues with weakness, fatigue and hematuria for couple of months.  Initially saw his primary care doctor .  Patient was referred to a urologist.  This morning however he was profoundly weak and felt like he might pass out.  He also felt like his right side was weaker than the left.  Also noticed some difficulty with his speech.  Last night when he went to bed at noon he was not having this issues but he did notice it when he woke up this morning at about 9 AM.  No headache.  No fevers.  No vomiting or diarrhea.  Appetite has been poor.  Past medical history: Hematuria  There are no problems to display for this patient.        No family history on file.  Social History   Tobacco Use   Smoking status: Every Day    Packs/day: 1.00    Types: Cigarettes  Vaping Use   Vaping Use: Never used  Substance Use Topics   Alcohol use: Never   Drug use: Never    Home Medications Prior to Admission medications   Not on File    Allergies    Patient has no known allergies.  Review of Systems   Review of Systems  All other systems reviewed and are negative.  Physical Exam Updated Vital Signs BP 126/77   Pulse 92   Temp 98.4 F (36.9 C) (Oral)   Resp (!) 25   SpO2 97%   Physical Exam Vitals and nursing note reviewed.  Constitutional:      General: He is not in acute distress.    Appearance: He is well-developed.  HENT:     Head: Normocephalic and atraumatic.     Right Ear: External ear normal.     Left Ear: External ear normal.  Eyes:     General: No scleral icterus.       Right eye: No discharge.        Left eye: No discharge.     Conjunctiva/sclera: Conjunctivae normal.  Neck:     Trachea: No tracheal deviation.   Cardiovascular:     Rate and Rhythm: Normal rate and regular rhythm.  Pulmonary:     Effort: Pulmonary effort is normal. No respiratory distress.     Breath sounds: Normal breath sounds. No stridor. No wheezing or rales.  Abdominal:     General: Bowel sounds are normal. There is no distension.     Palpations: Abdomen is soft.     Tenderness: There is no abdominal tenderness. There is no guarding or rebound.  Musculoskeletal:        General: No tenderness or deformity.     Cervical back: Neck supple.  Skin:    General: Skin is warm and dry.     Findings: No rash.  Neurological:     General: No focal deficit present.     Mental Status: He is alert and oriented to person, place, and time.     Cranial Nerves: Cranial nerve deficit (no facial droop, extraocular movements intact, hesitation with speech, slightly slurred) present.     Sensory: No sensory deficit.     Motor: No abnormal muscle tone or seizure activity.  Coordination: Coordination normal.     Comments: pronator drift right  upper extrem, drift right lower extremity able to lift off the bed, sensation intact in all extremities, no visual field cuts, no left or right sided neglect, , no nystagmus noted   Psychiatric:        Mood and Affect: Mood normal.    ED Results / Procedures / Treatments   Labs (all labs ordered are listed, but only abnormal results are displayed) Labs Reviewed  APTT - Abnormal; Notable for the following components:      Result Value   aPTT 48 (*)    All other components within normal limits  CBC - Abnormal; Notable for the following components:   WBC 11.5 (*)    RBC 3.32 (*)    Hemoglobin 9.9 (*)    HCT 30.0 (*)    Platelets 143 (*)    All other components within normal limits  DIFFERENTIAL - Abnormal; Notable for the following components:   Neutro Abs 9.7 (*)    Abs Immature Granulocytes 0.14 (*)    All other components within normal limits  COMPREHENSIVE METABOLIC PANEL - Abnormal;  Notable for the following components:   Sodium 131 (*)    Glucose, Bld 115 (*)    Calcium 8.3 (*)    Total Protein 5.6 (*)    Albumin 2.6 (*)    All other components within normal limits  CBG MONITORING, ED - Abnormal; Notable for the following components:   Glucose-Capillary 113 (*)    All other components within normal limits  I-STAT CHEM 8, ED - Abnormal; Notable for the following components:   Sodium 133 (*)    Glucose, Bld 109 (*)    Calcium, Ion 1.11 (*)    Hemoglobin 9.5 (*)    HCT 28.0 (*)    All other components within normal limits  RESP PANEL BY RT-PCR (FLU A&B, COVID) ARPGX2  ETHANOL  PROTIME-INR  RAPID URINE DRUG SCREEN, HOSP PERFORMED  URINALYSIS, ROUTINE W REFLEX MICROSCOPIC    EKG None  Radiology CT ABDOMEN PELVIS WO CONTRAST  Result Date: 2021-10-12 CLINICAL DATA:  Hematuria, unknown cause EXAM: CT ABDOMEN AND PELVIS WITHOUT CONTRAST TECHNIQUE: Multidetector CT imaging of the abdomen and pelvis was performed following the standard protocol without IV contrast. COMPARISON:  None. FINDINGS: Lower chest: Lung bases are clear. Small mucoid material in the RIGHT mainstem bronchus. Linear pleuroparenchymal thickening at the RIGHT lung base suggests atelectasis. Hepatobiliary: No focal hepatic lesion. No biliary duct dilatation. Common bile duct is normal. Pancreas: Pancreas is normal. No ductal dilatation. No pancreatic inflammation. Spleen: Normal spleen Adrenals/urinary tract: Adrenal glands are normal. No nephrolithiasis or ureterolithiasis. No obstructive uropathy. Vascular calcifications in the renal hila. No bladder calculi. Stomach/Bowel: Stomach, small-bowel and cecum are normal. The appendix is not identified but there is no pericecal inflammation to suggest appendicitis. The colon and rectosigmoid colon are normal. Vascular/Lymphatic: Abdominal aorta is normal caliber. No periportal or retroperitoneal adenopathy. No pelvic adenopathy. Reproductive: Prostate  unremarkable Other: No free fluid. Musculoskeletal: No aggressive osseous lesion. IMPRESSION: 1. No nephrolithiasis, ureterolithiasis or obstructive uropathy. 2.  Aortic Atherosclerosis (ICD10-I70.0). Electronically Signed   By: Genevive Bi M.D.   On: 10-12-2021 14:26   CT HEAD WO CONTRAST  Result Date: 2021-10-12 CLINICAL DATA:  Neuro deficit, acute, stroke suspected EXAM: CT HEAD WITHOUT CONTRAST TECHNIQUE: Contiguous axial images were obtained from the base of the skull through the vertex without intravenous contrast. COMPARISON:  None. FINDINGS: Brain: Asymmetric  hypodensities within the anterior right frontal white matter, concerning for age indeterminate infarct. Additional patchy white matter hypodensities are nonspecific but compatible with mild chronic microvascular ischemic disease. No evidence of acute hemorrhage, mass lesion, abnormal mass effect, or hydrocephalus. Vascular: No hyperdense vessel identified. Calcific intracranial atherosclerosis. Skull: No acute fracture. Sinuses/Orbits: Visualized sinuses are largely clear. Partially imaged mucosal thickening in the inferior left maxillary sinus. Unremarkable orbits. Other: No mastoid effusions.  The IMPRESSION: Findings concerning for age indeterminate infarct in the anterior right frontal white matter, potentially acute or subacute. Recommend MRI to further evaluate. Electronically Signed   By: Feliberto Harts M.D.   On: 2021/10/14 14:21   DG Chest Portable 1 View  Result Date: 2021/10/14 CLINICAL DATA:  Hematuria, weakness EXAM: PORTABLE CHEST 1 VIEW COMPARISON:  09/30/2021 FINDINGS: Stable cardiomediastinal contours. Slightly decreased lung volumes. No focal airspace consolidation, pleural effusion, or pneumothorax. IMPRESSION: No active disease. Electronically Signed   By: Duanne Guess D.O.   On: 10/14/2021 13:37    Procedures Procedures   Medications Ordered in ED Medications  sodium chloride 0.9 % bolus 500 mL (0 mLs  Intravenous Stopped Oct 14, 2021 1353)    Followed by  0.9 %  sodium chloride infusion (100 mL/hr Intravenous New Bag/Given Oct 14, 2021 1359)    ED Course  I have reviewed the triage vital signs and the nursing notes.  Pertinent labs & imaging results that were available during my care of the patient were reviewed by me and considered in my medical decision making (see chart for details).  Clinical Course as of 2021-10-14 1456  Thu 10/14/2021  1252 Reviewed with Dr Otelia Limes. Pt not an LVO.  Outside TPA window.  Will proceed with ED workup. [JK]  1414 Metabolic panel notable for decreased sodium as well as calcium.  Hemoglobin does show a decrease at 9.9 [JK]  1414 Per patient he has been having issues with hematuria and anemia. [JK]    Clinical Course User Index [JK] Linwood Dibbles, MD   MDM Rules/Calculators/A&P                          Patient presented with new onset symptoms concerning for possible stroke.  He does have findings of weakness involving the right side.  He also has some speech difficulties. CT scan is concerning for the possibility of stroke.  We will proceed with MR imaging to confirm.  I will consult with the medical service for admission and further treatment  Discussed anemia findings with patient and wife.  Patient is not sure what his hemoglobin has been running.  He has not noticed any blood in his stool.  He is supposed to be referred to urology.  I am unable to access [place] records.  Will see if we can obtain them to compare his labs. Final Clinical Impression(s) / ED Diagnoses Final diagnoses:  Cerebrovascular accident (CVA), unspecified mechanism (HCC)  Anemia, unspecified type  Hematuria, unspecified type     Linwood Dibbles, MD October 14, 2021 1458

## 2021-10-08 NOTE — ED Triage Notes (Signed)
C/o generalized weakness/fatigue/hematuria for 2 months. Saw PCP for UTI symptoms. This am vertigo/tachycardia/near syncopal episode and slid down wall in BR. No injury, denies pain. Weakness right arm, otherwise stroke negative. BP 100/60 prior to 1500cc NS then 130/80, and HR now 95.

## 2021-10-09 ENCOUNTER — Inpatient Hospital Stay (HOSPITAL_COMMUNITY): Payer: BC Managed Care – PPO

## 2021-10-09 ENCOUNTER — Encounter (HOSPITAL_COMMUNITY): Payer: Self-pay | Admitting: Internal Medicine

## 2021-10-09 DIAGNOSIS — I609 Nontraumatic subarachnoid hemorrhage, unspecified: Secondary | ICD-10-CM | POA: Diagnosis not present

## 2021-10-09 DIAGNOSIS — I6340 Cerebral infarction due to embolism of unspecified cerebral artery: Secondary | ICD-10-CM | POA: Diagnosis not present

## 2021-10-09 DIAGNOSIS — R319 Hematuria, unspecified: Secondary | ICD-10-CM

## 2021-10-09 DIAGNOSIS — E871 Hypo-osmolality and hyponatremia: Secondary | ICD-10-CM | POA: Diagnosis present

## 2021-10-09 DIAGNOSIS — I619 Nontraumatic intracerebral hemorrhage, unspecified: Secondary | ICD-10-CM

## 2021-10-09 DIAGNOSIS — R29717 NIHSS score 17: Secondary | ICD-10-CM | POA: Diagnosis not present

## 2021-10-09 DIAGNOSIS — I639 Cerebral infarction, unspecified: Secondary | ICD-10-CM

## 2021-10-09 DIAGNOSIS — D649 Anemia, unspecified: Secondary | ICD-10-CM | POA: Diagnosis not present

## 2021-10-09 DIAGNOSIS — D509 Iron deficiency anemia, unspecified: Secondary | ICD-10-CM | POA: Diagnosis present

## 2021-10-09 DIAGNOSIS — R531 Weakness: Secondary | ICD-10-CM

## 2021-10-09 DIAGNOSIS — R29738 NIHSS score 38: Secondary | ICD-10-CM | POA: Diagnosis not present

## 2021-10-09 DIAGNOSIS — E785 Hyperlipidemia, unspecified: Secondary | ICD-10-CM | POA: Diagnosis present

## 2021-10-09 DIAGNOSIS — Z20822 Contact with and (suspected) exposure to covid-19: Secondary | ICD-10-CM | POA: Diagnosis present

## 2021-10-09 DIAGNOSIS — R29714 NIHSS score 14: Secondary | ICD-10-CM | POA: Diagnosis not present

## 2021-10-09 DIAGNOSIS — I63442 Cerebral infarction due to embolism of left cerebellar artery: Secondary | ICD-10-CM | POA: Diagnosis present

## 2021-10-09 DIAGNOSIS — I6389 Other cerebral infarction: Secondary | ICD-10-CM

## 2021-10-09 DIAGNOSIS — N280 Ischemia and infarction of kidney: Secondary | ICD-10-CM | POA: Diagnosis present

## 2021-10-09 DIAGNOSIS — G9340 Encephalopathy, unspecified: Secondary | ICD-10-CM

## 2021-10-09 DIAGNOSIS — R509 Fever, unspecified: Secondary | ICD-10-CM

## 2021-10-09 DIAGNOSIS — G8191 Hemiplegia, unspecified affecting right dominant side: Secondary | ICD-10-CM | POA: Diagnosis present

## 2021-10-09 DIAGNOSIS — I340 Nonrheumatic mitral (valve) insufficiency: Secondary | ICD-10-CM | POA: Diagnosis present

## 2021-10-09 DIAGNOSIS — J9600 Acute respiratory failure, unspecified whether with hypoxia or hypercapnia: Secondary | ICD-10-CM | POA: Diagnosis not present

## 2021-10-09 DIAGNOSIS — F17210 Nicotine dependence, cigarettes, uncomplicated: Secondary | ICD-10-CM | POA: Diagnosis present

## 2021-10-09 DIAGNOSIS — I330 Acute and subacute infective endocarditis: Secondary | ICD-10-CM | POA: Diagnosis not present

## 2021-10-09 DIAGNOSIS — R29703 NIHSS score 3: Secondary | ICD-10-CM | POA: Diagnosis present

## 2021-10-09 DIAGNOSIS — R4781 Slurred speech: Secondary | ICD-10-CM | POA: Diagnosis present

## 2021-10-09 DIAGNOSIS — E876 Hypokalemia: Secondary | ICD-10-CM | POA: Diagnosis present

## 2021-10-09 DIAGNOSIS — Z515 Encounter for palliative care: Secondary | ICD-10-CM | POA: Diagnosis not present

## 2021-10-09 DIAGNOSIS — G9341 Metabolic encephalopathy: Secondary | ICD-10-CM | POA: Diagnosis present

## 2021-10-09 DIAGNOSIS — R29725 NIHSS score 25: Secondary | ICD-10-CM | POA: Diagnosis not present

## 2021-10-09 DIAGNOSIS — Z960 Presence of urogenital implants: Secondary | ICD-10-CM

## 2021-10-09 DIAGNOSIS — D72829 Elevated white blood cell count, unspecified: Secondary | ICD-10-CM | POA: Diagnosis present

## 2021-10-09 DIAGNOSIS — N138 Other obstructive and reflux uropathy: Secondary | ICD-10-CM | POA: Diagnosis present

## 2021-10-09 LAB — CBC
HCT: 27.1 % — ABNORMAL LOW (ref 39.0–52.0)
HCT: 28.6 % — ABNORMAL LOW (ref 39.0–52.0)
Hemoglobin: 8.9 g/dL — ABNORMAL LOW (ref 13.0–17.0)
Hemoglobin: 9.5 g/dL — ABNORMAL LOW (ref 13.0–17.0)
MCH: 29.3 pg (ref 26.0–34.0)
MCH: 29.9 pg (ref 26.0–34.0)
MCHC: 32.8 g/dL (ref 30.0–36.0)
MCHC: 33.2 g/dL (ref 30.0–36.0)
MCV: 89.1 fL (ref 80.0–100.0)
MCV: 89.9 fL (ref 80.0–100.0)
Platelets: 142 10*3/uL — ABNORMAL LOW (ref 150–400)
Platelets: 222 10*3/uL (ref 150–400)
RBC: 3.04 MIL/uL — ABNORMAL LOW (ref 4.22–5.81)
RBC: 3.18 MIL/uL — ABNORMAL LOW (ref 4.22–5.81)
RDW: 13.2 % (ref 11.5–15.5)
RDW: 13.1 % (ref 11.5–15.5)
WBC: 7.4 10*3/uL (ref 4.0–10.5)
WBC: 16.6 10*3/uL — ABNORMAL HIGH (ref 4.0–10.5)
nRBC: 0 % (ref 0.0–0.2)
nRBC: 0 % (ref 0.0–0.2)

## 2021-10-09 LAB — COMPREHENSIVE METABOLIC PANEL
ALT: 16 U/L (ref 0–44)
AST: 18 U/L (ref 15–41)
Albumin: 2.5 g/dL — ABNORMAL LOW (ref 3.5–5.0)
Alkaline Phosphatase: 70 U/L (ref 38–126)
Anion gap: 11 (ref 5–15)
BUN: 10 mg/dL (ref 8–23)
CO2: 21 mmol/L — ABNORMAL LOW (ref 22–32)
Calcium: 8.1 mg/dL — ABNORMAL LOW (ref 8.9–10.3)
Chloride: 98 mmol/L (ref 98–111)
Creatinine, Ser: 0.8 mg/dL (ref 0.61–1.24)
GFR, Estimated: >60 mL/min (ref >60)
Glucose, Bld: 111 mg/dL — ABNORMAL HIGH (ref 70–99)
Potassium: 3.2 mmol/L — ABNORMAL LOW (ref 3.5–5.1)
Sodium: 130 mmol/L — ABNORMAL LOW (ref 135–145)
Total Bilirubin: 1.1 mg/dL (ref 0.3–1.2)
Total Protein: 5.4 g/dL — ABNORMAL LOW (ref 6.5–8.1)

## 2021-10-09 LAB — TYPE AND SCREEN
ABO/RH(D): O NEG
Antibody Screen: NEGATIVE

## 2021-10-09 LAB — LIPID PANEL
Cholesterol: 126 mg/dL (ref 0–200)
HDL: 21 mg/dL — ABNORMAL LOW (ref >40)
LDL Cholesterol: 83 mg/dL (ref 0–99)
Total CHOL/HDL Ratio: 6 RATIO
Triglycerides: 112 mg/dL (ref <150)
VLDL: 22 mg/dL (ref 0–40)

## 2021-10-09 LAB — PHOSPHORUS: Phosphorus: 2.4 mg/dL — ABNORMAL LOW (ref 2.5–4.6)

## 2021-10-09 LAB — BASIC METABOLIC PANEL
Anion gap: 14 (ref 5–15)
BUN: 9 mg/dL (ref 8–23)
CO2: 18 mmol/L — ABNORMAL LOW (ref 22–32)
Calcium: 7.6 mg/dL — ABNORMAL LOW (ref 8.9–10.3)
Chloride: 92 mmol/L — ABNORMAL LOW (ref 98–111)
Creatinine, Ser: 0.84 mg/dL (ref 0.61–1.24)
GFR, Estimated: >60 mL/min (ref >60)
Glucose, Bld: 231 mg/dL — ABNORMAL HIGH (ref 70–99)
Potassium: 3.1 mmol/L — ABNORMAL LOW (ref 3.5–5.1)
Sodium: 124 mmol/L — ABNORMAL LOW (ref 135–145)

## 2021-10-09 LAB — ECHOCARDIOGRAM COMPLETE
Area-P 1/2: 3.85 cm2
Height: 69 in
S' Lateral: 3.1 cm
Weight: 2388.02 oz

## 2021-10-09 LAB — MAGNESIUM: Magnesium: 1.6 mg/dL — ABNORMAL LOW (ref 1.7–2.4)

## 2021-10-09 LAB — PROTIME-INR
INR: 1.2 (ref 0.8–1.2)
Prothrombin Time: 15.4 seconds — ABNORMAL HIGH (ref 11.4–15.2)

## 2021-10-09 LAB — HIV ANTIBODY (ROUTINE TESTING W REFLEX): HIV Screen 4th Generation wRfx: NONREACTIVE

## 2021-10-09 LAB — FERRITIN: Ferritin: 390 ng/mL — ABNORMAL HIGH (ref 24–336)

## 2021-10-09 LAB — HEMOGLOBIN A1C
Hgb A1c MFr Bld: 5.7 % — ABNORMAL HIGH (ref 4.8–5.6)
Mean Plasma Glucose: 116.89 mg/dL

## 2021-10-09 LAB — LACTIC ACID, PLASMA
Lactic Acid, Venous: 0.7 mmol/L (ref 0.5–1.9)
Lactic Acid, Venous: 1.8 mmol/L (ref 0.5–1.9)

## 2021-10-09 LAB — IRON AND TIBC
Iron: 21 ug/dL — ABNORMAL LOW (ref 45–182)
Saturation Ratios: 10 % — ABNORMAL LOW (ref 17.9–39.5)
TIBC: 213 ug/dL — ABNORMAL LOW (ref 250–450)
UIBC: 192 ug/dL

## 2021-10-09 LAB — HEMOGLOBIN AND HEMATOCRIT, BLOOD
HCT: 27.4 % — ABNORMAL LOW (ref 39.0–52.0)
Hemoglobin: 9.3 g/dL — ABNORMAL LOW (ref 13.0–17.0)

## 2021-10-09 LAB — TROPONIN I (HIGH SENSITIVITY)
Troponin I (High Sensitivity): 48 ng/L — ABNORMAL HIGH (ref <18)
Troponin I (High Sensitivity): 62 ng/L — ABNORMAL HIGH (ref <18)

## 2021-10-09 LAB — ABO/RH: ABO/RH(D): O NEG

## 2021-10-09 LAB — CK: Total CK: 27 U/L — ABNORMAL LOW (ref 49–397)

## 2021-10-09 LAB — GLUCOSE, CAPILLARY: Glucose-Capillary: 187 mg/dL — ABNORMAL HIGH (ref 70–99)

## 2021-10-09 IMAGING — CT CT CHEST-ABD-PELV W/ CM
2 of 5 series · 13 of 36 positions shown, 15 images · IV contrast (omnipaque)
Comparison: CT abdomen pelvis [DATE].

CLINICAL DATA: Stroke, follow up. Hematuria pt unable to drink
contrast. In total only drank [DATE] of one bottle.

EXAM:
CT CHEST, ABDOMEN, AND PELVIS WITH CONTRAST
TECHNIQUE: Multidetector CT imaging of the chest, abdomen and pelvis was
performed following the standard protocol during bolus
administration of intravenous contrast.
CONTRAST:  100mL OMNIPAQUE IOHEXOL 300 MG/ML  SOLN

[Series 3: cap 5.0 i31f 2 · axial · 0.89mm/px · z∈[+830,+1400]mm · 10 of 140 slices shown, 12 images]
[im 13/140  mediastinal]
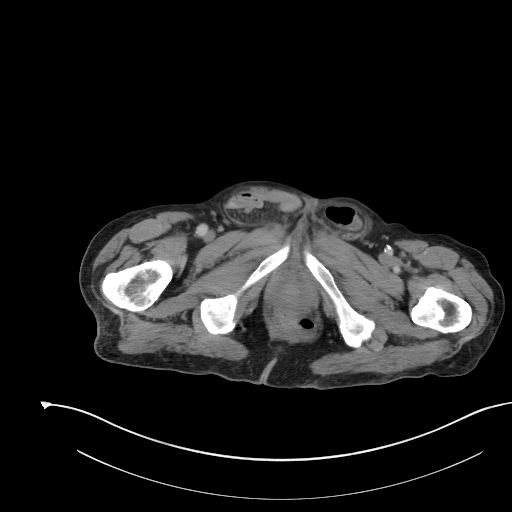
[im 13/140  bone]
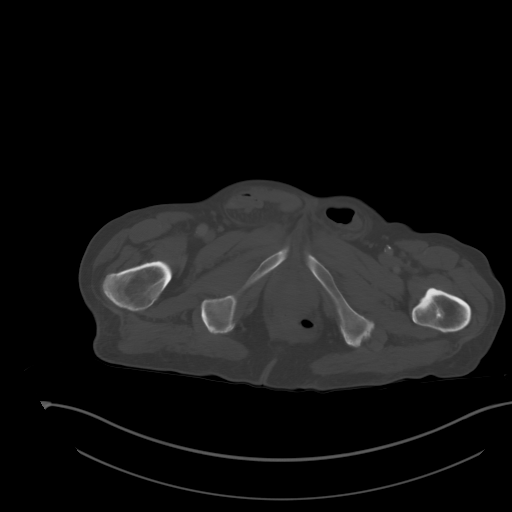
[im 26/140  mediastinal]
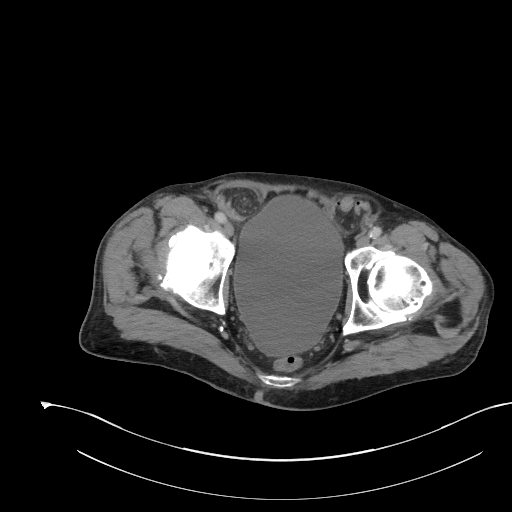
[im 38/140  mediastinal]
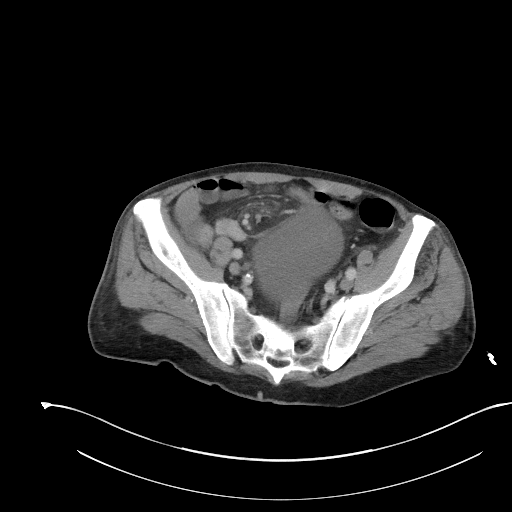
[im 51/140  mediastinal]
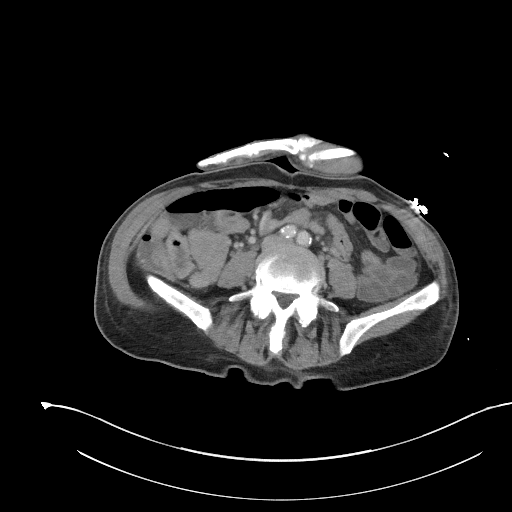
[im 64/140  mediastinal]
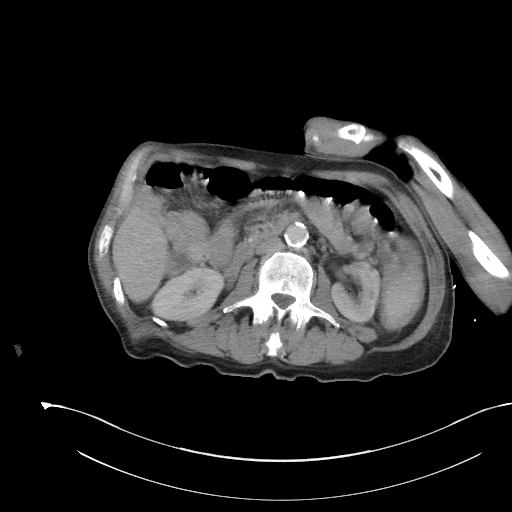
[im 76/140  mediastinal]
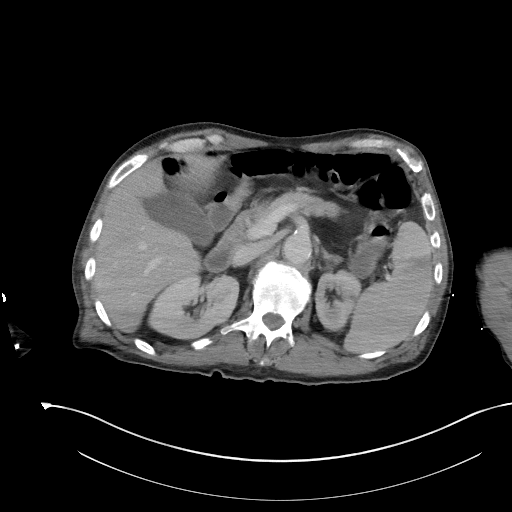
[im 89/140  mediastinal]
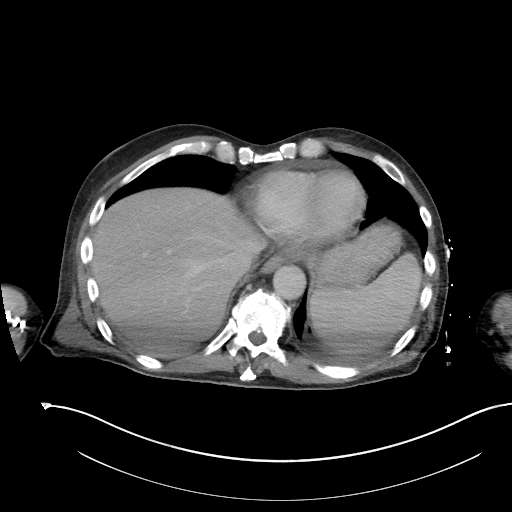
[im 102/140  mediastinal]
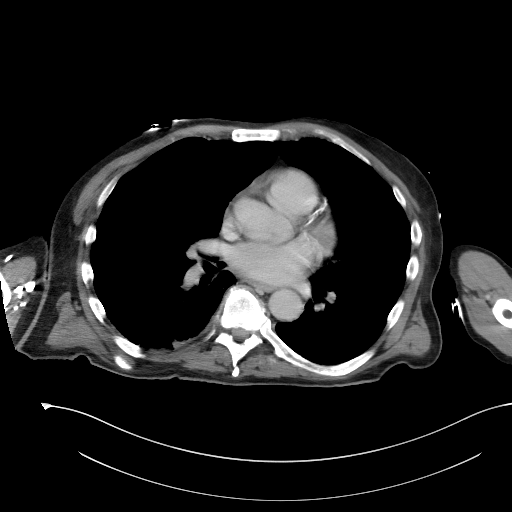
[im 114/140  mediastinal]
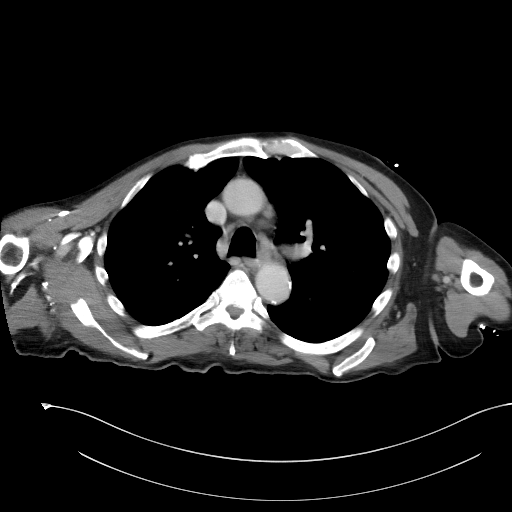
[im 114/140  bone]
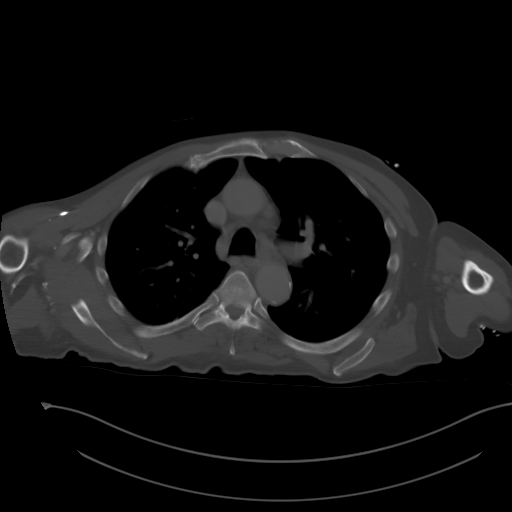
[im 127/140  mediastinal]
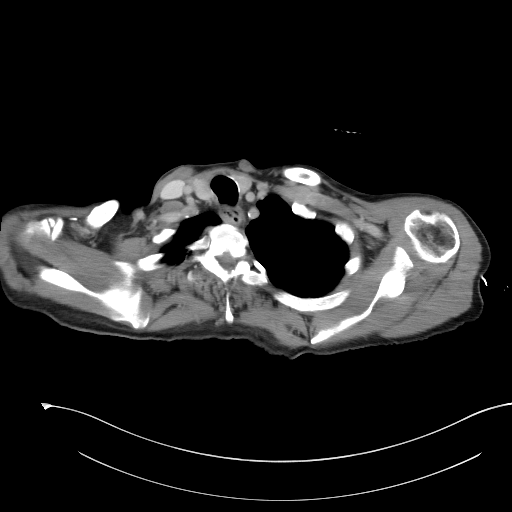

[Series 5: coronal · coronal · 0.78mm/px · 3 of 127 slices shown]
[im 26/127  mediastinal]
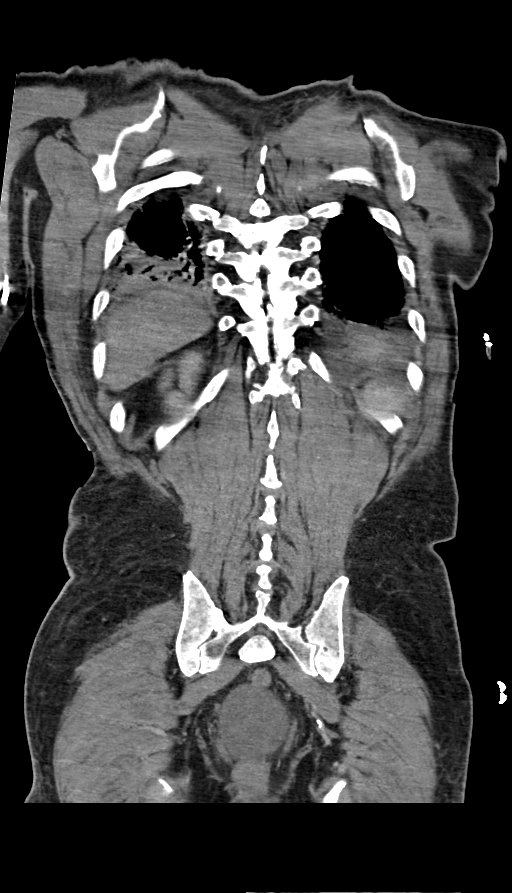
[im 51/127  mediastinal]
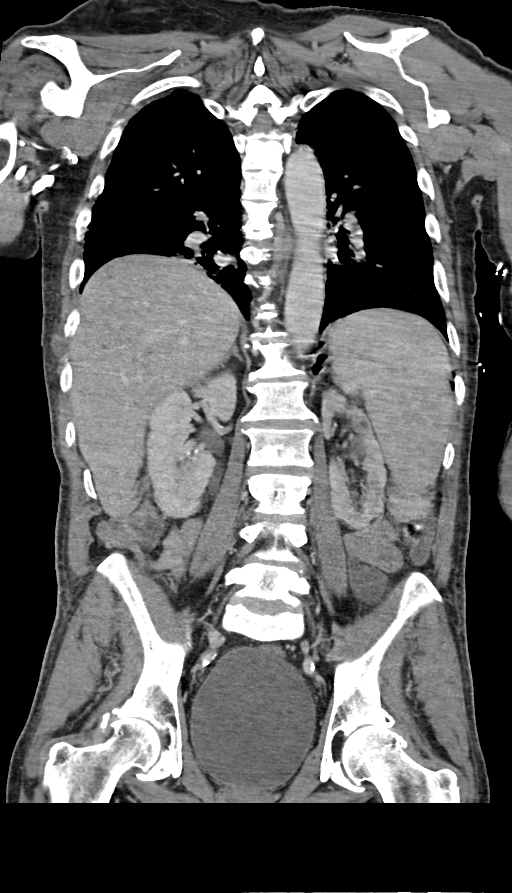
[im 76/127  mediastinal]
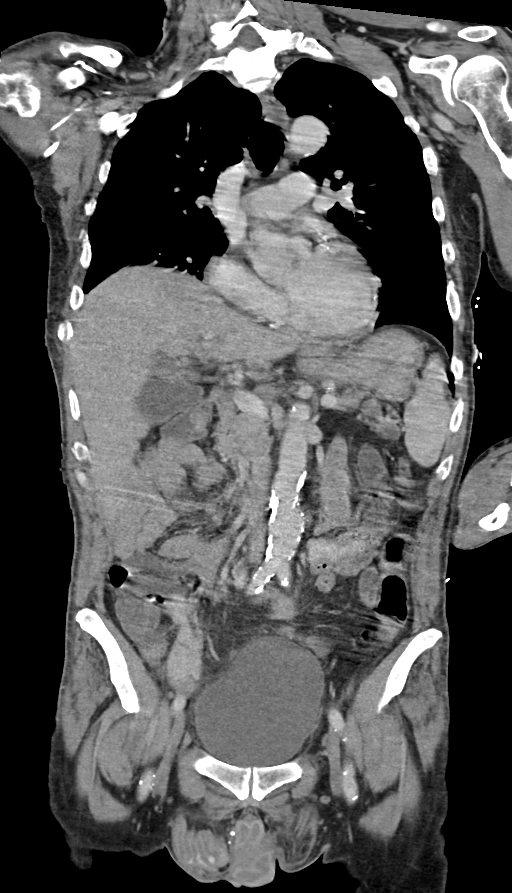

[13 of 36 positions shown; findings below may reference images not displayed]

FINDINGS: Limited evaluation due to respiratory motion artifact.

CT CHEST FINDINGS

Cardiovascular: Normal heart size. No significant pericardial
effusion. The thoracic aorta is normal in caliber. Mild
atherosclerotic plaque of the thoracic aorta. At least 3 vessel
coronary artery calcifications.

Mediastinum/Nodes: No enlarged mediastinal, hilar, or axillary lymph
nodes. Thyroid gland, trachea, and esophagus demonstrate no
significant findings.

Lungs/Pleura: At least mild to moderate centrilobular emphysematous
changes. Right lower lobe subsegmental atelectasis. No pulmonary
nodule. No pulmonary mass. No pleural effusion. No pneumothorax.

Musculoskeletal:

No chest wall abnormality.

No suspicious lytic or blastic osseous lesions. No acute displaced
fracture. Multilevel degenerative changes of the spine.

CT ABDOMEN PELVIS FINDINGS

Hepatobiliary:

Subcentimeter hypodensity too small to characterize. No focal liver
abnormality. No gallstones, gallbladder wall thickening, or
pericholecystic fluid. No biliary dilatation.

Pancreas: No focal lesion. Normal pancreatic contour. No surrounding
inflammatory changes. No main pancreatic ductal dilatation.

Spleen: Wedge-shaped hypodensity within the splenic parenchyma
measuring up to approximately 2 cm on axial imaging ([DATE]). Spleen
is normal in size.

Adrenals/Urinary Tract:

No adrenal nodule bilaterally.

Poorly defined approximately 3 cm on coronal imaging wedge-shaped
hypodensity centered within the interpolar region of the left
kidney. Otherwise bilateral kidneys enhance symmetrically. Round
subcentimeter hypodensity within left kidney and right kidney too
small to characterize.

No hydronephrosis. No hydroureter.

The urinary bladder is distended with fluid.

Stomach/Bowel: Stomach is within normal limits. No evidence of bowel
wall thickening or dilatation. Appendix appears normal.

Vascular/Lymphatic: No abdominal aorta or iliac aneurysm. Severe
atherosclerotic plaque of the aorta and its branches. No abdominal,
pelvic, or inguinal lymphadenopathy.

Reproductive: The prostate is enlarged measuring up to 5 cm.

Other: No intraperitoneal free fluid. No intraperitoneal free gas.
No organized fluid collection.

Musculoskeletal:

At least moderate volume bilateral inguinal hernias containing a
loop of large bowel on the left and a loop of small bowel on the
right. The mesentery just anterior to the entrance site of the right
inguinal hernia is noted to swirl.

No suspicious lytic or blastic osseous lesions. No acute displaced
fracture. Multilevel degenerative changes of the spine.
IMPRESSION: 1. Limited evaluation due to respiratory motion artifact.
2. Right lower lobe subsegmental atelectasis. Superimposed
infection/inflammation not excluded.
3. Wedge-shaped hypodensities within the spleen and left kidney
suggestive of infarctions. Differential diagnosis for left kidney
includes focal pyelonephritis. Recommend correlation with
urinalysis.
4. At least moderate volume bilateral inguinal hernias containing a
loop of large bowel on the left and a loop of small bowel on the
right. No CT findings to suggest associated bowel ischemia or
obstruction.
5. Prostatomegaly with the urinary bladder distended with urine.
6. Aortic Atherosclerosis ([6L]-[6L]) and Emphysema ([6L]-[6L]).

## 2021-10-09 IMAGING — DX DG CHEST 1V PORT
1 series · 1 of 1 positions shown · non-contrast
Comparison: Radiograph yesterday.  CT earlier today.

CLINICAL DATA: Endotracheal tube placement post cardiac arrest.

EXAM:
PORTABLE CHEST 1 VIEW

[chest]
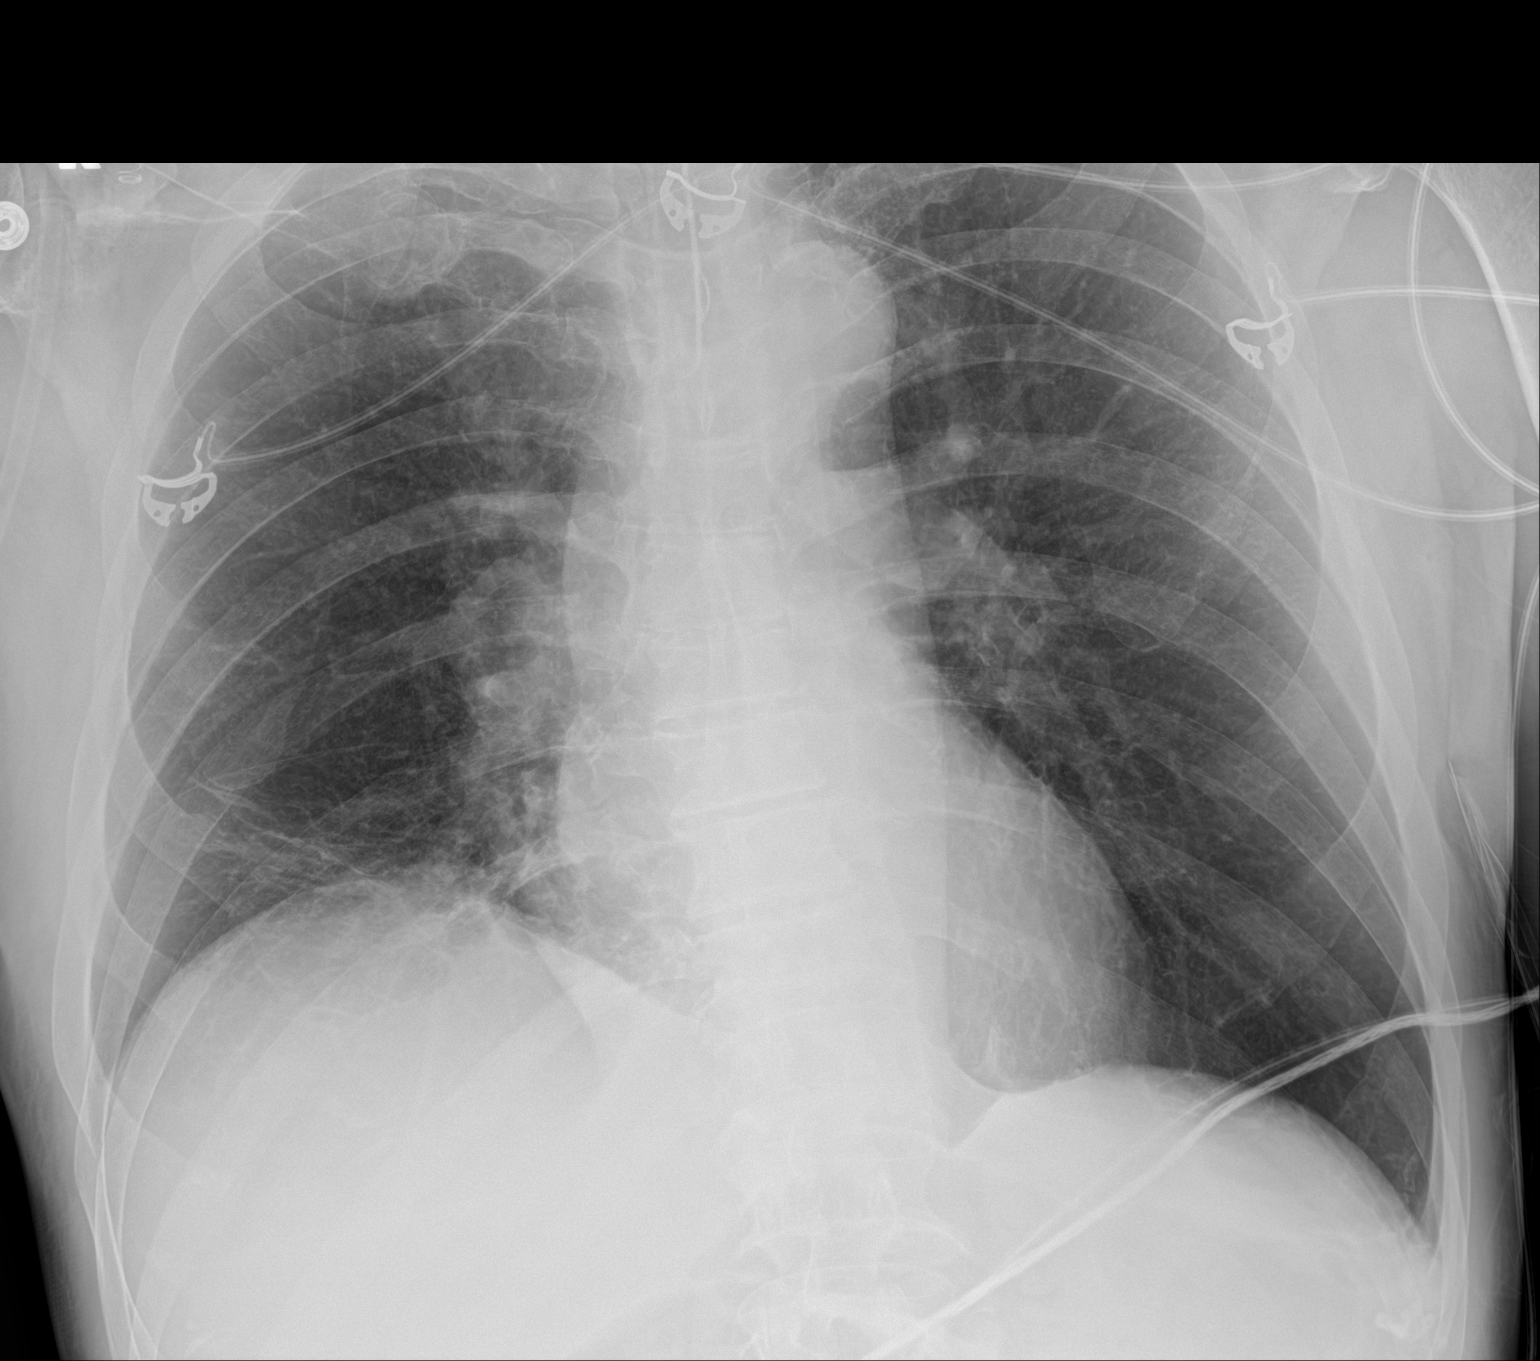

[1 of 1 positions shown; findings below may reference images not displayed]

FINDINGS: Endotracheal tube tip 3.8 cm from the carina. Increasing atelectasis
at the right lung base. No pneumothorax or large pleural effusion.
No pulmonary edema. Normal heart size with stable mediastinal
contours. On limited evaluation, no acute osseous abnormalities are
seen.
IMPRESSION: 1. Endotracheal tube tip 3.8 cm from the carina.
2. Increasing right basilar atelectasis.

## 2021-10-09 MED ORDER — DEXTROSE-NACL 5-0.9 % IV SOLN: INTRAVENOUS | Status: DC

## 2021-10-09 MED ORDER — PANTOPRAZOLE SODIUM 40 MG IV SOLR: 40.0000 mg | Freq: Every day | INTRAVENOUS | Status: DC

## 2021-10-09 MED ORDER — VANCOMYCIN HCL 750 MG/150ML IV SOLN
750.0000 mg | Freq: Two times a day (BID) | INTRAVENOUS | Status: DC
  Administered 2021-10-09: 750 mg via INTRAVENOUS
  Filled 2021-10-09 (×2): qty 150

## 2021-10-09 MED ORDER — VANCOMYCIN HCL 1250 MG/250ML IV SOLN
1250.0000 mg | Freq: Once | INTRAVENOUS | Status: AC
  Administered 2021-10-09: 1250 mg via INTRAVENOUS
  Filled 2021-10-09: qty 250

## 2021-10-09 MED ORDER — NOREPINEPHRINE 4 MG/250ML-% IV SOLN
0.0000 ug/min | Freq: Once | INTRAVENOUS | Status: DC
  Filled 2021-10-09: qty 250

## 2021-10-09 MED ORDER — ORAL CARE MOUTH RINSE: 15.0000 mL | OROMUCOSAL | Status: DC

## 2021-10-09 MED ORDER — DEXTROSE-NACL 5-0.45 % IV SOLN: INTRAVENOUS | Status: DC

## 2021-10-09 MED ORDER — TAMSULOSIN HCL 0.4 MG PO CAPS
0.4000 mg | ORAL_CAPSULE | Freq: Every day | ORAL | Status: DC
  Administered 2021-10-09: 0.4 mg via ORAL
  Filled 2021-10-09: qty 1

## 2021-10-09 MED ORDER — IOHEXOL 300 MG/ML  SOLN
100.0000 mL | Freq: Once | INTRAMUSCULAR | Status: AC | PRN
  Administered 2021-10-09: 100 mL via INTRAVENOUS

## 2021-10-09 MED ORDER — CHLORHEXIDINE GLUCONATE 0.12% ORAL RINSE (MEDLINE KIT)
15.0000 mL | Freq: Two times a day (BID) | OROMUCOSAL | Status: DC
  Administered 2021-10-09: 15 mL via OROMUCOSAL

## 2021-10-09 MED ORDER — MAGNESIUM SULFATE 2 GM/50ML IV SOLN
2.0000 g | Freq: Once | INTRAVENOUS | Status: AC
  Administered 2021-10-09: 2 g via INTRAVENOUS
  Filled 2021-10-09: qty 50

## 2021-10-09 MED ORDER — IOHEXOL 9 MG/ML PO SOLN
ORAL | Status: AC
  Administered 2021-10-09: 500 mL
  Filled 2021-10-09: qty 1000

## 2021-10-09 MED ORDER — ATORVASTATIN CALCIUM 40 MG PO TABS: 40.0000 mg | ORAL_TABLET | Freq: Every day | ORAL | Status: DC

## 2021-10-09 MED ORDER — ORAL CARE MOUTH RINSE
15.0000 mL | OROMUCOSAL | Status: DC
  Administered 2021-10-10 (×2): 15 mL via OROMUCOSAL

## 2021-10-09 MED ORDER — CHLORHEXIDINE GLUCONATE 0.12% ORAL RINSE (MEDLINE KIT): 15.0000 mL | Freq: Two times a day (BID) | OROMUCOSAL | Status: DC

## 2021-10-09 MED ORDER — CHLORHEXIDINE GLUCONATE CLOTH 2 % EX PADS: 6.0000 | MEDICATED_PAD | Freq: Every day | CUTANEOUS | Status: DC

## 2021-10-09 MED ORDER — SODIUM CHLORIDE 0.9 % IV SOLN
2.0000 g | Freq: Every day | INTRAVENOUS | Status: DC
  Administered 2021-10-09: 2 g via INTRAVENOUS
  Filled 2021-10-09: qty 20

## 2021-10-09 MED ORDER — POTASSIUM CHLORIDE 10 MEQ/100ML IV SOLN
10.0000 meq | INTRAVENOUS | Status: AC
Start: 2021-10-09 — End: 2021-10-09
  Administered 2021-10-09 (×4): 10 meq via INTRAVENOUS
  Filled 2021-10-09 (×4): qty 100

## 2021-10-09 NOTE — Evaluation (Signed)
Speech Language Pathology Evaluation Patient Details Name: Lance Spears MRN: [id number] DOB: 09/12/1961 Today's Date: 10/09/2021 Time: 2703-5009 SLP Time Calculation (min) (ACUTE ONLY): 17 min  Problem List:  Patient Active Problem List   Diagnosis Date Noted   Stroke (cerebrum) (HCC) 10/17/2021   CVA (cerebral vascular accident) (HCC) 09/22/2021   Anemia    Past Medical History: History reviewed. No pertinent past medical history.  HPI:  Lance Spears is a 60 y.o. male PMHx past medical history BPH with indwelling catheter, iron deficiency anemia, chronic strokes with acute right-sided weakness found to have acute, multifocal foci of embolic strokes. MRI 10/21: There are scattered small foci of restricted diffusion within the cerebral hemispheres bilaterally involving frontoparietal lobes as well as the left insula. Small focus of restricted diffusion within the peripheral left cerebellum.   Assessment / Plan / Recommendation Clinical Impression  Pt lethargic, febrile today, participated in limited initial assessment. His son, Lance Spears, was at bedside and very supportive.  Lance Spears followed one-step commands intermittently; he named his son and provided his own name.  No other verbal output could be elicited. He smiled when his son made a joke.  He engaged in his own oral care with max assist to hold toothbrush with LUE and attend to mouth. He did not follow oral-motor commands due to lethargy. Unable to fully assess presence of aphasia or specific disordered cognitive processes given his current mental status.  Recommend ongoing SLP f/u and reassessment as mentation improves. Discussed with Lance Spears the pending OT/PT evaluations and recommendations for further rehab.  He verbalized understanding.    SLP Assessment  SLP Recommendation/Assessment: Patient needs continued Speech Lanaguage Pathology Services SLP Visit Diagnosis: Cognitive communication deficit (R41.841)    Recommendations for follow  up therapy are one component of a multi-disciplinary discharge planning process, led by the attending physician.  Recommendations may be updated based on patient status, additional functional criteria and insurance authorization.    Follow Up Recommendations  Other (comment) (tba)    Frequency and Duration min 3x week  2 weeks      SLP Evaluation Cognition  Overall Cognitive Status: Impaired/Different from baseline Orientation Level: (P) Oriented to person Attention: Sustained Sustained Attention: Impaired       Comprehension  Auditory Comprehension Overall Auditory Comprehension: Impaired Yes/No Questions: Impaired Basic Biographical Questions: 0-25% accurate Commands: Impaired Conversation: Simple Interfering Components: Attention Reading Comprehension Reading Status: Not tested    Expression Expression Primary Mode of Expression: Verbal Verbal Expression Overall Verbal Expression: Impaired Initiation: Impaired Automatic Speech:  (unable) Level of Generative/Spontaneous Verbalization: Phrase Repetition:  (unable) Naming: Not tested Written Expression Dominant Hand: Right   Oral / Motor  Oral Motor/Sensory Function Overall Oral Motor/Sensory Function: Mild impairment Facial Symmetry: Abnormal symmetry right;Suspected CN VII (facial) dysfunction Motor Speech Overall Motor Speech: Impaired Respiration: Within functional limits Phonation: Normal Resonance: Within functional limits Articulation: Impaired Level of Impairment: Phrase Intelligibility: Intelligibility reduced Word: 50-74% accurate Phrase: 50-74% accurate Sentence: Not tested Conversation: Not tested                      Lance Folks L. Samson Frederic, MA CCC/SLP Acute Rehabilitation Services Office number [phone] Pager [phone]  Blenda Mounts Laurice 10/09/2021, 12:14 PM

## 2021-10-09 NOTE — Progress Notes (Signed)
? ?  Inpatient Rehab Admissions Coordinator : ? ?Per therapy recommendations, patient was screened for CIR candidacy by Kaleb Linquist RN MSN.  At this time patient appears to be a potential candidate for CIR. I will place a rehab consult per protocol for full assessment. Please call me with any questions. ? ?Elaya Droege RN MSN ?Admissions Coordinator ?[phone] ?  ?

## 2021-10-09 NOTE — Progress Notes (Signed)
Lower extremity venous bilateral study completed.   Please see CV Proc for preliminary results.   Holmes Hays, RDMS, RVT  

## 2021-10-09 NOTE — Consult Note (Signed)
Neurology Consult H&P  Lance Spears MR# [id number] 10/09/2021  CC: right sided weakness  History is obtained from: Nursing staff, wife, patient and chart.  HPI: Lance Spears is a 60 y.o. male PMHx BPH, urinary retention, chronic iron deficiency anemia (iron supplement), hematuria, presented with generalized weakness found to have right-sided paresis and aphasia all improved on evaluation in ED.  Last night when he went to bed he was normal and when he woke about 0900 he did noticed right sided weakness.  No headache. No vomiting or diarrhea.   Appetite has been poor.  The following information was taken from Hospitalist admission note 10-20-21:  "Patient complained to family about fatigue and generalized weakness this morning, wife noticed patient has hard time to grab with right hand, same time also has had slurred speech.  Patient however has no major complaints, saying his strength on both arms is good, no numbness on any other limbs.  No headache no blurry vision.   Patient was recently found to have microscopic hematuria and referred to see urology.  And a Foley catheter was inserted at PCPs office on 10/16th."    LKW: unclear tNK given: No symptoms resolved. IR Thrombectomy No Modified Rankin Scale: 0-Completely asymptomatic and back to baseline post- stroke NIHSS: 11 LOC Responsiveness 0 LOC Questions 1 LOC Commands 0 Horizontal eye movement 0 Visual field 1 Facial palsy 1 Motor arm - Right arm 3 Motor arm - Left arm 0 Motor leg - Right leg 3 Motor leg - Left leg 0 Limb ataxia 0 Sensory test 1 Language 1 Speech 0 Extinction and inattention 0  ROS: A complete ROS was performed and is negative except as noted in the HPI.   No family history on file.  Social History:  reports that he has been smoking cigarettes. He has been smoking an average of 1 pack per day. He does not have any smokeless tobacco history on file. He reports that he does not drink alcohol and does  not use drugs.   Prior to Admission medications   Medication Sig Start Date End Date Taking? Authorizing Provider  aspirin-acetaminophen-caffeine (EXCEDRIN MIGRAINE) [id number] MG tablet Take 1 tablet by mouth every 6 (six) hours as needed for headache.   Yes [provider]  Ferrous Sulfate (IRON PO) Take 1 tablet by mouth daily.   Yes [provider]   Exam: Current vital signs: BP 137/82 (BP Location: Right Arm)   Pulse (!) 103   Temp (!) 102.7 F (39.3 C) (Oral)   Resp 20   SpO2 98%   Physical Exam  Constitutional: Appears well-developed and well-nourished.  Psych: Affect appropriate to situation Eyes: No scleral injection HENT: No OP obstruction. Head: Normocephalic.  Cardiovascular: Normal rate and regular rhythm.  Respiratory: Effort normal, symmetric excursions bilaterally, no audible wheezing. GI: Soft.  No distension. There is no tenderness.  Skin: WDI  Neuro: The patient is restless mildly agitated. Mental Status: Patient is awake, alert, oriented to person, place. He is able to answer simple questions however he is not able to give a history. He follows commands consistently Speech seems fluent, intact comprehension and repetition. No signs of aphasia or neglect. Visual Fields seems to have right field deficit.  Pupils are equal, round, and reactive to light. EOMI, mild ptosis on the right.  Facial sensation is symmetric to temperature Facial movement is symmetric.  Hearing is intact to voice. Uvula midline and palate elevates symmetrically. Shoulder shrug is symmetric. Tongue is midline without atrophy  or fasciculations.  Tone is normal. Bulk is normal.  Good strength in the left upper and lower extremities.  Right upper extremity without effort against gravity and falls to the bed.  He is unable to lift his right leg off the bed and when lifting falls to the bed however he does have preserved right plantarflexion at the ankle. Sensation is  symmetric to light touch and temperature in the arms and legs. Deep Tendon Reflexes: 2+ and symmetric in the biceps and patellae. Babinski on the right  He is unable to do FNF and HKS bilaterally. gait - Deferred  I have reviewed labs in epic and the pertinent results are: H&H 9.9--->8.9 APTT 48 Na 133 Calcium 8.3/INS calcium 1.1 LDL 83 A1c 5.7  I have reviewed the images obtained: MRI brain showed scattered small foci of restricted diffusion in cerebral hemispheres bilaterally involving frontoparietal lobes and left insula. Small stroke in peripheral left cerebellum. Chronic infarcts of the anterior right frontal white matter. Additional patchy foci of T2 hyperintensity in the supratentorial white matter are nonspecific but may reflect mild chronic microvascular ischemic changes. There are curvilinear foci of sulcal susceptibility likely reflecting sequelae of prior subarachnoid hemorrhage.  MRA head and neck did not show large vessel occlusion or significant flow-limiting stenoses.   Assessment: Lance Spears is a 60 y.o. male PMHx past medical history BPH with indwelling catheter, iron deficiency anemia, chronic strokes with acute right-sided weakness found to have acute, multifocal foci of embolic strokes.  MRA head and neck without significant flow-limiting stenoses and therefore pattern of stroke suggest a central source such as cardiac. He received aspirin 81 mg in the emergency room.  He is hypocalcemic and currently restless/mildly agitated, encephalopathic and febrile 100.13F --->102.7 (oral @ 0530) and will need emergent labs.  He also had significant decrease in hemoglobin 9.9 (admission) ---> 8.9 within a 24-hour period will recheck H&H to rule out lab error as well as type and screen in the event he needs transfusion.  Plan: Metabolic/infectious workup: STAT labs: H&H, type and screen, lactic acid, mag, phos, blood Cx, UA with Ucx.  Started empiric antibiotic coverage:  -  Ceftriaxone 2g daily.  - Pharmacy consult for Vancomycin.  Recommend TTE. Continue aspirin 81mg  daily. Clopidogrel 75mg  daily for 3 weeks.  Maintain O2 sats > 94%. Continue fluid hydration for euvolemia. Blood pressure: MAP >65 SBP 120-160 Relative euglycemia (~ <180) Normothermia - For temperature >37.5C - acetaminophen 650mg  q4-6 hours PRN. Replete electrolytes as needed.  Telemetry monitoring for arrhythmia. Recommend bedside Swallow screen. Recommend Stroke education. Recommend PT/OT/SLP consult. Precautions: Aspiration/fall  This patient is critically ill and at significant risk of neurological worsening, death and care requires constant monitoring of vital signs, hemodynamics,respiratory and cardiac monitoring, neurological assessment, discussion with family, other specialists and medical decision making of high complexity. I spent 73 minutes of neurocritical care time  in the care of  this patient. This was time spent independent of any time provided by nurse practitioner or PA.  Electronically signed by:  , MD Page: 10/09/2021, 5:40 AM

## 2021-10-09 NOTE — Progress Notes (Signed)
Pharmacy Antibiotic Note  Estell Dillinger is a 60 y.o. male with fevers, indwelling foley, possible urosepsis.  Pharmacy has been consulted for Vancomycin  dosing.  Plan: Vancomycin 1250 mg IV now then 750 mg IV q12h  Height: 5\' 9"  (175.3 cm) Weight: 67.7 kg (149 lb 4 oz) IBW/kg (Calculated) : 70.7  Temp (24hrs), Avg:99.8 F (37.7 C), Min:98.4 F (36.9 C), Max:102.7 F (39.3 C)  Recent Labs  Lab 10/02/2021 1241 10/07/2021 1338 10/09/21 0150  WBC 11.5*  --  7.4  CREATININE 0.90 0.80  --     Estimated Creatinine Clearance: 91.7 mL/min (by C-G formula based on SCr of 0.8 mg/dL).    No Known Allergies   10/11/21 10/09/2021 6:30 AM

## 2021-10-09 NOTE — Progress Notes (Addendum)
[facility] PROGRESS NOTE    Progress Note  Lance Spears  BSW:[id number] DOB: 1961-08-24 DOA: 09/23/2021 PCP: Irven Coe, MD     Brief Narrative:   Lance Spears is an 59 y.o. male past medical history significant for BPH, urinary retention with an indwelling Foley catheter chronic iron deficiency anemia is brought in by the family for fatigue and generalized weakness.  He was recently found to have hematuria referred to a urologist Foley catheter was inserted by his PCP on 10/04/2021, as per family he also fell weak on his right side more than the left and noted some difficulty in speech CT of the head showed a right anterior frontal white matter stroke of indeterminate age    Assessment/Plan:   Fever of unclear source: UA was done that showed 6-10 white blood cells with moderate urine dipstick, check a CK. Mild leukocytosis with 11, and left shift, influenza PCR and SARS-CoV-2 are negative, HIV is negative. His leukocytosis is now resolved. Chest x-ray showed no acute cardiopulmonary disease He has no nuchal rigidity UDS and alcohol level is negative. Blood cultures has been sent.  CT scan of the abdomen pelvis showed no nephrolithiasis or obstructive uropathy some are aortic atherosclerosis. He was given Tylenol for his fevers vitals are stable. Started on antibiotics empirically overnight Vanc. and rocephin.  Small acute CVA on bilateral hemispheres and left frontal cerebellum: Concern about embolic stroke, twelve-lead EKG shows sinus rhythm he is being monitored on telemetry with no events. HgbA1c 5.7, fasting lipid panel, HDL of 21 LDL of less than 90 MRI of the brain showed multiple small acute bilateral cerebral hemisphere infarcts and left cerebellar infarct PT, OT, Speech consult pending MRI of the carotids are patent no stenosis. Transthoracic Echo, pending Start patient on ASA 81mg  daily, continue statins. BP goal: permissive HTN upto 220/120 mmHg Telemetry  monitoring Consult neurology  Acute metabolic encephalopathy: Question due to stroke, work-up for infectious etiology underway blood cultures and urine cultures have been sent, CT scan of the abdomen and pelvis showed no source of infection.  BPH: In the setting of chronic obstructive uropathy with indwelling Foley catheter. Foley exchanged, will need to follow-up with urology as an outpatient.  Chronic iron deficiency anemia: Doubt this is solely hematuria, will need to follow-up with GI as an outpatient.  Microscopic hematuria: Follow-up with urology as an outpatient.     DVT prophylaxis: lovenox Family Communication:noen Status is: Observation  The patient will require care spanning > 2 midnights and should be moved to inpatient because: Acute CVA with new onset of fever and acute encephalopathy work-up for infectious etiology.    Code Status:     Code Status Orders  (From admission, onward)           Start     Ordered   10/19/2021 1547  Full code  Continuous        10/13/2021 1547           Code Status History     This patient has a current code status but no historical code status.         IV Access:   Peripheral IV   Procedures and diagnostic studies:   CT ABDOMEN PELVIS WO CONTRAST  Result Date: 10/11/2021 CLINICAL DATA:  Hematuria, unknown cause EXAM: CT ABDOMEN AND PELVIS WITHOUT CONTRAST TECHNIQUE: Multidetector CT imaging of the abdomen and pelvis was performed following the standard protocol without IV contrast. COMPARISON:  None. FINDINGS: Lower chest: Lung bases are clear.  Small mucoid material in the RIGHT mainstem bronchus. Linear pleuroparenchymal thickening at the RIGHT lung base suggests atelectasis. Hepatobiliary: No focal hepatic lesion. No biliary duct dilatation. Common bile duct is normal. Pancreas: Pancreas is normal. No ductal dilatation. No pancreatic inflammation. Spleen: Normal spleen Adrenals/urinary tract: Adrenal glands are  normal. No nephrolithiasis or ureterolithiasis. No obstructive uropathy. Vascular calcifications in the renal hila. No bladder calculi. Stomach/Bowel: Stomach, small-bowel and cecum are normal. The appendix is not identified but there is no pericecal inflammation to suggest appendicitis. The colon and rectosigmoid colon are normal. Vascular/Lymphatic: Abdominal aorta is normal caliber. No periportal or retroperitoneal adenopathy. No pelvic adenopathy. Reproductive: Prostate unremarkable Other: No free fluid. Musculoskeletal: No aggressive osseous lesion. IMPRESSION: 1. No nephrolithiasis, ureterolithiasis or obstructive uropathy. 2.  Aortic Atherosclerosis (ICD10-I70.0). Electronically Signed   By: Genevive Bi M.D.   On: 11/03/2021 14:26   CT HEAD WO CONTRAST  Result Date: 11-03-21 CLINICAL DATA:  Neuro deficit, acute, stroke suspected EXAM: CT HEAD WITHOUT CONTRAST TECHNIQUE: Contiguous axial images were obtained from the base of the skull through the vertex without intravenous contrast. COMPARISON:  None. FINDINGS: Brain: Asymmetric hypodensities within the anterior right frontal white matter, concerning for age indeterminate infarct. Additional patchy white matter hypodensities are nonspecific but compatible with mild chronic microvascular ischemic disease. No evidence of acute hemorrhage, mass lesion, abnormal mass effect, or hydrocephalus. Vascular: No hyperdense vessel identified. Calcific intracranial atherosclerosis. Skull: No acute fracture. Sinuses/Orbits: Visualized sinuses are largely clear. Partially imaged mucosal thickening in the inferior left maxillary sinus. Unremarkable orbits. Other: No mastoid effusions.  The IMPRESSION: Findings concerning for age indeterminate infarct in the anterior right frontal white matter, potentially acute or subacute. Recommend MRI to further evaluate. Electronically Signed   By: Feliberto Harts M.D.   On: 2021-11-03 14:21   MR ANGIO HEAD WO  CONTRAST  Result Date: 11/03/21 CLINICAL DATA:  Neuro deficit, acute, stroke suspected stroke EXAM: MRA HEAD WITHOUT CONTRAST TECHNIQUE: Angiographic images of the Circle of Willis were acquired using MRA technique without intravenous contrast. COMPARISON:  No pertinent prior exam. FINDINGS: Anterior circulation: Intracranial internal carotid arteries are patent. Anterior and middle cerebral arteries are patent. Posterior circulation: Included intracranial vertebral arteries are patent. Basilar artery is patent. Major cerebellar artery origins are patent. Bilateral posterior communicating arteries are present. There is fetal origin of the left PCA. Posterior cerebral arteries are patent. Other: No aneurysm. IMPRESSION: No proximal intracranial vessel occlusion or significant stenosis. Electronically Signed   By: Guadlupe Spanish M.D.   On: Nov 03, 2021 16:57   MR Angiogram Neck W or Wo Contrast  Result Date: 2021-11-03 CLINICAL DATA:  Neuro deficit, acute, stroke suspected EXAM: MRA NECK WITHOUT AND WITH CONTRAST TECHNIQUE: Multiplanar and multiecho pulse sequences of the neck were obtained without and with intravenous contrast. Angiographic images of the neck were obtained using MRA technique without and with intravenous contrast. CONTRAST:  7.64mL GADAVIST GADOBUTROL 1 MMOL/ML IV SOLN COMPARISON:  None. FINDINGS: Great vessel origins are patent. Common, internal, and external carotid arteries are patent. There is no significant stenosis. Codominant extracranial vertebral arteries are patent.  No stenosis. IMPRESSION: Patent carotid and vertebral arteries without stenosis. Electronically Signed   By: Guadlupe Spanish M.D.   On: 03-Nov-2021 18:04   MR BRAIN WO CONTRAST  Result Date: 11-03-2021 CLINICAL DATA:  Neuro deficit, acute, stroke suspected EXAM: MRI HEAD WITHOUT CONTRAST TECHNIQUE: Multiplanar, multiecho pulse sequences of the brain and surrounding structures were obtained without intravenous  contrast. COMPARISON:  None.  FINDINGS: Brain: There are scattered small foci of restricted diffusion within the cerebral hemispheres bilaterally involving frontoparietal lobes as well as the left insula. Small focus of restricted diffusion within the peripheral left cerebellum. Chronic infarcts of the anterior right frontal white matter. Additional patchy foci of T2 hyperintensity in the supratentorial white matter are nonspecific but may reflect mild chronic microvascular ischemic changes. There are curvilinear foci of sulcal susceptibility likely reflecting sequelae of prior subarachnoid hemorrhage. There is no intracranial mass or mass effect. There is no hydrocephalus or extra-axial fluid collection. Vascular: Major vessel flow voids at the skull base are preserved. Skull and upper cervical spine: Normal marrow signal is preserved. Sinuses/Orbits: Minor mucosal thickening.  Orbits are unremarkable. Other: Sella is unremarkable.  Mastoid air cells are clear. IMPRESSION: Small acute infarcts of bilateral cerebral hemispheres and left cerebellum. Chronic microvascular ischemic changes and additional chronic infarcts. Evidence of prior subarachnoid hemorrhage. Electronically Signed   By: Guadlupe Spanish M.D.   On: 10/07/2021 17:11   DG Chest Portable 1 View  Result Date: 09/28/2021 CLINICAL DATA:  Hematuria, weakness EXAM: PORTABLE CHEST 1 VIEW COMPARISON:  09/30/2021 FINDINGS: Stable cardiomediastinal contours. Slightly decreased lung volumes. No focal airspace consolidation, pleural effusion, or pneumothorax. IMPRESSION: No active disease. Electronically Signed   By: Duanne Guess D.O.   On: 09/23/2021 13:37     Medical Consultants:   None.   Subjective:    Dierre Crevier nonverbal  Objective:    Vitals:   10/16/2021 2216 10/09/21 0536 10/09/21 0621 10/09/21 0654  BP: 137/82     Pulse: (!) 103     Resp: 20     Temp: 98.4 F (36.9 C) (!) 102.7 F (39.3 C)  (!) 101.5 F (38.6 C)  TempSrc:  Oral Oral  Oral  SpO2: 98%     Weight:   67.7 kg   Height:   5\' 9"  (1.753 m)    SpO2: 98 %   Intake/Output Summary (Last 24 hours) at 10/09/2021 0710 Last data filed at 10/09/2021 0200 Gross per 24 hour  Intake 491.67 ml  Output 1000 ml  Net -508.33 ml   Filed Weights   10/09/21 0621  Weight: 67.7 kg    Exam: General exam: In no acute distress. Respiratory system: Good air movement and clear to auscultation. Cardiovascular system: S1 & S2 heard, RRR. No JVD. Gastrointestinal system: Abdomen is nondistended, soft and nontender.  Central nervous system: Only responsive to pain, moving all 4 extremities a little bit of a facial droop. Extremities: No pedal edema. Skin: No rashes, lesions or ulcers Psychiatry: J no judgment or insight of medical condition   Data Reviewed:    Labs: Basic Metabolic Panel: Recent Labs  Lab 09/25/2021 1241 10/05/2021 1338 10/09/21 0533  NA 131* 133* 130*  K 3.5 3.5 3.2*  CL 99 98 98  CO2 23  --  21*  GLUCOSE 115* 109* 111*  BUN 12 11 10   CREATININE 0.90 0.80 0.80  CALCIUM 8.3*  --  8.1*  MG  --   --  1.6*  PHOS  --   --  2.4*   GFR Estimated Creatinine Clearance: 91.7 mL/min (by C-G formula based on SCr of 0.8 mg/dL). Liver Function Tests: Recent Labs  Lab 10/18/2021 1241 10/09/21 0533  AST 19 18  ALT 17 16  ALKPHOS 75 70  BILITOT 0.8 1.1  PROT 5.6* 5.4*  ALBUMIN 2.6* 2.5*   No results for input(s): LIPASE, AMYLASE in the last 168 hours. No  results for input(s): AMMONIA in the last 168 hours. Coagulation profile Recent Labs  Lab 09/28/2021 1241 10/09/21 0533  INR 1.2 1.2   COVID-19 Labs  Recent Labs    10/16/2021 2227  FERRITIN 390*    Lab Results  Component Value Date   SARSCOV2NAA NEGATIVE 10/09/2021    CBC: Recent Labs  Lab 09/20/2021 1241 10/04/2021 1338 10/09/21 0150 10/09/21 0533  WBC 11.5*  --  7.4  --   NEUTROABS 9.7*  --   --   --   HGB 9.9* 9.5* 8.9* 9.3*  HCT 30.0* 28.0* 27.1* 27.4*  MCV 90.4  --   89.1  --   PLT 143*  --  142*  --    Cardiac Enzymes: No results for input(s): CKTOTAL, CKMB, CKMBINDEX, TROPONINI in the last 168 hours. BNP (last 3 results) No results for input(s): PROBNP in the last 8760 hours. CBG: Recent Labs  Lab 09/30/2021 1225  GLUCAP 113*   D-Dimer: No results for input(s): DDIMER in the last 72 hours. Hgb A1c: Recent Labs    10/09/21 0150  HGBA1C 5.7*   Lipid Profile: Recent Labs    10/09/21 0150  CHOL 126  HDL 21*  LDLCALC 83  TRIG 149  CHOLHDL 6.0   Thyroid function studies: No results for input(s): TSH, T4TOTAL, T3FREE, THYROIDAB in the last 72 hours.  Invalid input(s): FREET3 Anemia work up: Recent Labs    09/20/2021 2227  FERRITIN 390*  TIBC 213*  IRON 21*   Sepsis Labs: Recent Labs  Lab 09/25/2021 1241 10/09/21 0150 10/09/21 0609  WBC 11.5* 7.4  --   LATICACIDVEN  --   --  0.7   Microbiology Recent Results (from the past 240 hour(s))  Resp Panel by RT-PCR (Flu A&B, Covid) Nasopharyngeal Swab     Status: None   Collection Time: 09/23/2021 12:41 PM   Specimen: Nasopharyngeal Swab; Nasopharyngeal(NP) swabs in vial transport medium  Result Value Ref Range Status   SARS Coronavirus 2 by RT PCR NEGATIVE NEGATIVE Final    Comment: (NOTE) SARS-CoV-2 target nucleic acids are NOT DETECTED.  The SARS-CoV-2 RNA is generally detectable in upper respiratory specimens during the acute phase of infection. The lowest concentration of SARS-CoV-2 viral copies this assay can detect is 138 copies/mL. A negative result does not preclude SARS-Cov-2 infection and should not be used as the sole basis for treatment or other patient management decisions. A negative result may occur with  improper specimen collection/handling, submission of specimen other than nasopharyngeal swab, presence of viral mutation(s) within the areas targeted by this assay, and inadequate number of viral copies(<138 copies/mL). A negative result must be combined  with clinical observations, patient history, and epidemiological information. The expected result is Negative.  Fact Sheet for Patients:  BloggerCourse.com  Fact Sheet for Healthcare Providers:  SeriousBroker.it  This test is no t yet approved or cleared by the [place] FDA and  has been authorized for detection and/or diagnosis of SARS-CoV-2 by FDA under an Emergency Use Authorization (EUA). This EUA will remain  in effect (meaning this test can be used) for the duration of the COVID-19 declaration under Section 564(b)(1) of the Act, 21 U.S.C.section 360bbb-3(b)(1), unless the authorization is terminated  or revoked sooner.       Influenza A by PCR NEGATIVE NEGATIVE Final   Influenza B by PCR NEGATIVE NEGATIVE Final    Comment: (NOTE) The Xpert Xpress SARS-CoV-2/FLU/RSV plus assay is intended as an aid in the diagnosis of influenza from  Nasopharyngeal swab specimens and should not be used as a sole basis for treatment. Nasal washings and aspirates are unacceptable for Xpert Xpress SARS-CoV-2/FLU/RSV testing.  Fact Sheet for Patients: BloggerCourse.com  Fact Sheet for Healthcare Providers: SeriousBroker.it  This test is not yet approved or cleared by the [place] FDA and has been authorized for detection and/or diagnosis of SARS-CoV-2 by FDA under an Emergency Use Authorization (EUA). This EUA will remain in effect (meaning this test can be used) for the duration of the COVID-19 declaration under Section 564(b)(1) of the Act, 21 U.S.C. section 360bbb-3(b)(1), unless the authorization is terminated or revoked.  Performed at [facility], [address] [place], [place] [address]   Culture, blood (routine x 2)     Status: None (Preliminary result)   Collection Time: 10/09/21  5:40 AM   Specimen: BLOOD  Result Value Ref Range Status   Specimen Description  BLOOD LEFT ANTECUBITAL  Final   Special Requests   Final    BOTTLES DRAWN AEROBIC AND ANAEROBIC Blood Culture adequate volume Performed at [facility], [address] [place], [place] [address]    Culture PENDING  Incomplete   Report Status PENDING  Incomplete     Medications:     stroke: mapping our early stages of recovery book   Does not apply Once   aspirin EC  81 mg Oral Daily   ferrous sulfate  325 mg Oral Q breakfast   Continuous Infusions:  sodium chloride 100 mL/hr (2021-10-25 2336)   cefTRIAXone (ROCEPHIN)  IV 2 g (10/09/21 0702)   vancomycin     vancomycin        LOS: 0 days   Marinda Elk  [facility]  10/09/2021, 7:10 AM

## 2021-10-09 NOTE — Progress Notes (Signed)
  Patient Name: Lance Spears   MRN: [id number]   Date of Birth/ Sex: 1961/04/23 , male      Admission Date: 09/24/2021  Attending Provider: Josephine Igo, DO  Primary Diagnosis: <principal problem not specified>    Indication: Pt was in his usual state of health until this PM, when he was noted to be not breathing by his wife. Code blue was subsequently called. At the time of arrival on scene, ACLS protocol was underway.   Technical Description:  - CPR performance duration:  4 minutes  - Was defibrillation or cardioversion used? No   - Was external pacer placed? No  - Was patient intubated pre/post CPR? Yes   Medications Administered: Y = Yes; Blank = No Amiodarone    Atropine    Calcium    Epinephrine  Y  Lidocaine    Magnesium    Norepinephrine    Phenylephrine    Sodium bicarbonate    Vasopressin     Post CPR evaluation:  - Final Status - Was patient successfully resuscitated ? Yes - What is current rhythm? Sinus tachycardia - What is current hemodynamic status? Stable on vasopressor  Miscellaneous Information:  - Labs sent, including: CBC, BMP, Troponins, ABG  - Primary team notified?  Paged primary team  - Family Notified? Yes  - Additional notes/ transfer status: New ST depressions found in lateral leads on EKG; handed off to ICU in 4N      Shareena Nusz N, DO  10/09/2021, 10:10 PM

## 2021-10-09 NOTE — Progress Notes (Addendum)
STROKE TEAM PROGRESS NOTE   ATTENDING NOTE: I reviewed above note and agree with the assessment and plan. Pt was seen and examined.   60 year old male with no significant medical history started to have fatigue, generalized weakness and end of August.  Patient follow-up with PCP found to have anemia, work-up found to have hematuria, pending fecal occult blood testing.  Yesterday, patient developed aphasia, right-sided weakness, came to ER for evaluation and also found to have high-grade fever.  Blood culture pending.  CT showed right frontal white matter possibly infarct.  MRI showed bilateral infarct left more than right and left cerebellum infarct.  MRA head and neck unremarkable.  A1c 5.7, LDL 83, LE venous Doppler no DVT, EF 60 to 65%, severe mitral valve regurgitation with prolapse of posterior MV level at, recommend further TEE.  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  Creatinine 0.8, sodium 130, hemoglobin 9.3, WBC 11.5->7.4.  Overnight T-max 102.7.  Currently on Rocephin and vancomycin.  On exam, wife at bedside, patient obtunded, restless, not following commands, nonverbal.  Left gaze preference, not tracking to the right side, blinking to visual threat on the left but not on the right.  Right facial droop, right upper and lower extremity flaccid.  Left upper and lower extremity spontaneous movement purposeful against gravity. Sensation, coordination not corporative and gait not tested.  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  We will follow-up with blood culture result.  Recommend TEE for further evaluation.  Also recommend ID consult and continue broad-spectrum antibiotics.  Okay with aspirin 81 during the interval time.  Currently n.p.o., on IV fluid, may consider statin once p.o. access.  PT/OT.  We will follow.  For detailed assessment and  plan, please refer to above as I have made changes wherever appropriate.   Marvel Plan, MD PhD Stroke Neurology 10/09/2021 7:03 PM    INTERVAL HISTORY  His wife and son are at the bedside at time of this exam. Tmax now 100.9/ on rocephin and vancomycin. Blood cultures done and pending.   Vitals:   10/09/21 0805 10/09/21 0815 10/09/21 1003 10/09/21 1152  BP:  127/72 128/76 117/74  Pulse:  80  100  Resp:  14 20 20   Temp: (!) 100.9 F (38.3 C) 98 F (36.7 C) (!) 100.4 F (38 C) (!) 101 F (38.3 C)  TempSrc: Oral Oral Oral Oral  SpO2:  98% 96% 97%  Weight:      Height:       CBC:  Recent Labs  Lab 11-Oct-2021 1241 October 11, 2021 1338 10/09/21 0150 10/09/21 0533  WBC 11.5*  --  7.4  --   NEUTROABS 9.7*  --   --   --   HGB 9.9*   < > 8.9* 9.3*  HCT 30.0*   < > 27.1* 27.4*  MCV 90.4  --  89.1  --   PLT 143*  --  142*  --    < > = values in this interval not displayed.   Basic Metabolic Panel:  Recent Labs  Lab October 11, 2021 1241 2021-10-11 1338 10/09/21 0533  NA 131* 133* 130*  K 3.5 3.5 3.2*  CL 99 98 98  CO2 23  --  21*  GLUCOSE 115* 109* 111*  BUN 12 11 10   CREATININE 0.90 0.80 0.80  CALCIUM 8.3*  --  8.1*  MG  --   --  1.6*  PHOS  --   --  2.4*    Lipid Panel:  Recent Labs  Lab 10/09/21 0150  CHOL 126  TRIG 112  HDL 21*  CHOLHDL 6.0  VLDL 22  LDLCALC 83    HgbA1c:  Recent Labs  Lab 10/09/21 0150  HGBA1C 5.7*   Urine Drug Screen:  Recent Labs  Lab 10-16-2021 1241  LABOPIA NONE DETECTED  COCAINSCRNUR NONE DETECTED  LABBENZ NONE DETECTED  AMPHETMU NONE DETECTED  THCU NONE DETECTED  LABBARB NONE DETECTED    Alcohol Level  Recent Labs  Lab 10-16-2021 1255  ETH <10    IMAGING past 24 hours MR ANGIO HEAD WO CONTRAST  Result Date: Oct 16, 2021 CLINICAL DATA:  Neuro deficit, acute, stroke suspected stroke EXAM: MRA HEAD WITHOUT CONTRAST TECHNIQUE: Angiographic images of the Circle of Willis were acquired using MRA technique without intravenous contrast.  COMPARISON:  No pertinent prior exam. FINDINGS: Anterior circulation: Intracranial internal carotid arteries are patent. Anterior and middle cerebral arteries are patent. Posterior circulation: Included intracranial vertebral arteries are patent. Basilar artery is patent. Major cerebellar artery origins are patent. Bilateral posterior communicating arteries are present. There is fetal origin of the left PCA. Posterior cerebral arteries are patent. Other: No aneurysm. IMPRESSION: No proximal intracranial vessel occlusion or significant stenosis. Electronically Signed   By: Guadlupe Spanish M.D.   On: 10-16-21 16:57   MR Angiogram Neck W or Wo Contrast  Result Date: 10-16-2021 CLINICAL DATA:  Neuro deficit, acute, stroke suspected EXAM: MRA NECK WITHOUT AND WITH CONTRAST TECHNIQUE: Multiplanar and multiecho pulse sequences of the neck were obtained without and with intravenous contrast. Angiographic images of the neck were obtained using MRA technique without and with intravenous contrast. CONTRAST:  7.82mL GADAVIST GADOBUTROL 1 MMOL/ML IV SOLN COMPARISON:  None. FINDINGS: Great vessel origins are patent. Common, internal, and external carotid arteries are patent. There is no significant stenosis. Codominant extracranial vertebral arteries are patent.  No stenosis. IMPRESSION: Patent carotid and vertebral arteries without stenosis. Electronically Signed   By: Guadlupe Spanish M.D.   On: 10/16/2021 18:04   MR BRAIN WO CONTRAST  Result Date: 16-Oct-2021 CLINICAL DATA:  Neuro deficit, acute, stroke suspected EXAM: MRI HEAD WITHOUT CONTRAST TECHNIQUE: Multiplanar, multiecho pulse sequences of the brain and surrounding structures were obtained without intravenous contrast. COMPARISON:  None. FINDINGS: Brain: There are scattered small foci of restricted diffusion within the cerebral hemispheres bilaterally involving frontoparietal lobes as well as the left insula. Small focus of restricted diffusion within the  peripheral left cerebellum. Chronic infarcts of the anterior right frontal white matter. Additional patchy foci of T2 hyperintensity in the supratentorial white matter are nonspecific but may reflect mild chronic microvascular ischemic changes. There are curvilinear foci of sulcal susceptibility likely reflecting sequelae of prior subarachnoid hemorrhage. There is no intracranial mass or mass effect. There is no hydrocephalus or extra-axial fluid collection. Vascular: Major vessel flow voids at the skull base are preserved. Skull and upper cervical spine: Normal marrow signal is preserved. Sinuses/Orbits: Minor mucosal thickening.  Orbits are unremarkable. Other: Sella is unremarkable.  Mastoid air cells are clear. IMPRESSION: Small acute infarcts of bilateral cerebral hemispheres and left cerebellum. Chronic microvascular ischemic changes and additional chronic infarcts. Evidence of prior subarachnoid hemorrhage. Electronically Signed   By: Guadlupe Spanish M.D.   On: 10-16-21 17:11   ECHOCARDIOGRAM COMPLETE  Result Date: 10/09/2021    ECHOCARDIOGRAM REPORT   Patient Name:   SHAFT CORIGLIANO Date of Exam: 10/09/2021 Medical Rec #:  [id number]  Height:       69.0 in Accession #:    [id number]  Weight:       149.3 lb Date of Birth:  12-31-1960   BSA:          1.824 m Patient Age:    60 years    BP:           128/76 mmHg Patient Gender: M           HR:           82 bpm. Exam Location:  Inpatient Procedure: 2D Echo Indications:    stroke  History:        Patient has no prior history of Echocardiogram examinations.  Sonographer:    Delcie Roch RDCS Referring Phys: [id number] [facility] General  Sonographer Comments: Image acquisition challenging due to respiratory motion. IMPRESSIONS  1. Thickened MV with prolapse of posterior MV leaflet; eccentric, anteriorly directed MR difficult to quantitate but possibly severe; suggest TEE to further assess.  2. Left ventricular ejection fraction, by estimation, is 60 to 65%. The left  ventricle has normal function. The left ventricle has no regional wall motion abnormalities. Left ventricular diastolic parameters were normal.  3. Right ventricular systolic function is normal. The right ventricular size is normal.  4. Left atrial size was moderately dilated.  5. The mitral valve is myxomatous. Severe mitral valve regurgitation. No evidence of mitral stenosis. There is moderate holosystolic prolapse of of the mitral valve.  6. The aortic valve is normal in structure. Aortic valve regurgitation is not visualized. No aortic stenosis is present.  7. Aortic dilatation noted. There is borderline dilatation of the ascending aorta, measuring 38 mm. Comparison(s): No prior Echocardiogram. FINDINGS  Left Ventricle: Left ventricular ejection fraction, by estimation, is 60 to 65%. The left ventricle has normal function. The left ventricle has no regional wall motion abnormalities. The left ventricular internal cavity size was normal in size. There is  no left ventricular hypertrophy. Left ventricular diastolic parameters were normal. Right Ventricle: The right ventricular size is normal. Right ventricular systolic function is normal. Left Atrium: Left atrial size was moderately dilated. Right Atrium: Right atrial size was normal in size. Pericardium: Trivial pericardial effusion is present. Mitral Valve: The mitral valve is myxomatous. There is moderate holosystolic prolapse of of the mitral valve. There is moderate thickening of the mitral valve leaflet(s). Severe mitral valve regurgitation. No evidence of mitral valve stenosis. Tricuspid Valve: The tricuspid valve is normal in structure. Tricuspid valve regurgitation is trivial. No evidence of tricuspid stenosis. Aortic Valve: The aortic valve is normal in structure. Aortic valve regurgitation is not visualized. No aortic stenosis is present. Pulmonic Valve: The pulmonic valve was normal in structure. Pulmonic valve regurgitation is not visualized. No  evidence of pulmonic stenosis. Aorta: Aortic dilatation noted. There is borderline dilatation of the ascending aorta, measuring 38 mm. Venous: The inferior vena cava was not well visualized. IAS/Shunts: The interatrial septum was not well visualized. Additional Comments: Thickened MV with prolapse of posterior MV leaflet; eccentric, anteriorly directed MR difficult to quantitate but possibly severe; suggest TEE to further assess.  LEFT VENTRICLE PLAX 2D LVIDd:         4.60 cm   Diastology LVIDs:         3.10 cm   LV e' medial:    11.70 cm/s LV PW:         1.10 cm   LV E/e' medial:  10.2 LV IVS:  0.90 cm   LV e' lateral:   16.10 cm/s LVOT diam:     2.10 cm   LV E/e' lateral: 7.4 LVOT Area:     3.46 cm  LEFT ATRIUM             Index        RIGHT ATRIUM           Index LA diam:        4.10 cm 2.25 cm/m   RA Area:     12.00 cm LA Vol (A2C):   89.4 ml 49.00 ml/m  RA Volume:   25.10 ml  13.76 ml/m LA Vol (A4C):   65.8 ml 36.07 ml/m LA Biplane Vol: 79.4 ml 43.52 ml/m   AORTA Ao Root diam: 3.50 cm Ao Asc diam:  3.80 cm MITRAL VALVE MV Area (PHT): 3.85 cm     SHUNTS MV Decel Time: 197 msec     Systemic Diam: 2.10 cm MV E velocity: 119.00 cm/s MV A velocity: 76.40 cm/s MV E/A ratio:  1.56 Olga Millers MD Electronically signed by Olga Millers MD Signature Date/Time: 10/09/2021/1:35:41 PM    Final     PHYSICAL EXAM    Neuro: The patient is restless mildly agitated. Mental Status: Patient is lethargic and not fully engaged at time of exam  He is not able to answer simple questions  He does not  follows commands    Visual Fields seems to have right field deficit.  Pupils are equal, round, and reactive to light. EOMI, mild ptosis on the right.  Facial sensation is symmetric to temperature Facial movement is symmetric.  Hearing is intact to voice. Uvula midline and palate elevates symmetrically. Unable to assess shoulder shrug  Tongue is midline without atrophy or fasciculations.  Tone is  normal. Bulk is normal.  Good strength in the left upper and lower extremities.  Right upper extremity without effort against gravity and falls to the bed.  He is unable to lift his right leg off the bed. however he does have preserved right plantarflexion at the ankle. Sensation is symmetric to light touch and temperature in the arms and legs. Deep Tendon Reflexes: 2+ and symmetric in the biceps and patellae. Babinski on the right  He is unable to do FNF and HKS bilaterally. gait - Deferred  ASSESSMENT/PLAN Mr. Euan Wandler is a 60 y.o. male with history of   BPH and urinary retention on indwelling Foley catheter, chronic iron deficiency anemia on iron supplement, hematuria, presented with generalized weakness.   Patient complained to family about fatigue and generalized weakness this morning, wife noticed patient has hard time to grab with right hand, same time also has had slurred speech.  Patient however has no major complaints, saying his strength on both arms is good, no numbness on any other limbs.  No headache no blurry vision.   Patient was recently found to have microscopic hematuria and referred to see urology.  And a Foley catheter was inserted at PCPs office on 10/16th.    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 MRI  BRAIN:  Small acute infarcts of bilateral cerebral hemispheres and left cerebellum Chronic microvascular ischemic changes and additional chronic infarcts. Evidence of prior subarachnoid hemorrhage.  MRA HEAD: No proximal intracranial vessel occlusion or significant stenosis. MRA  neck: Patent carotid and vertebral  arteries without stenosis. 2D Echo EF 60-65%, mod dilated LA, interatrial septum not well visualized  Recommend TEE for further evaluation LDL 83 HgbA1c  5.7 VTE prophylaxis - scd     Diet   Diet Heart Room service appropriate? Yes; Fluid consistency: Thin   No antithrombotic prior to admission, now on aspirin 81 mg daily.   Therapy recommendations:  pending  Disposition:  pending   Hypertension Home meds:  none  Permissive hypertension (OK if < 220/120) but gradually normalize in 5-7 days Long-term BP goal normotensive  Hyperlipidemia Home meds:  none   LDL 83, goal < 70 Consider statin once p.o. access   Other Stroke Risk Factors   Other Active Problems ? Endocarditis - blood culture pending, on Rocephin and vancomycin  Hospital day # 0     To contact Stroke Continuity provider, please refer to [email or web address]. After hours, contact General Neurology

## 2021-10-09 NOTE — Significant Event (Signed)
Rapid Response Event Note   Reason for Call : Code [facility] 2125   2128: On arrival, compressions being performed. Pads placed on pt and Zoll attached. IV team at bedside placing PIV (2129).  2130: Epi given 2133: 7.5 ETT placed by RT, ROSC- HR 152, BP 94/53 (66), spO2 91% 2135: 1L NS bolus started 2140: Levo started at 2149: Pt transferred to 4N-18 with RT, RRT, RN, code MD. HR 120, BP 101/63 (73), spO2 100%  2200: Debrief done on 3W with all staff   MD Notified: Code Md in room. CCM consulted Call Time: 2125 Arrival Time: 2128 End Time: 2215  Mordecai Rasmussen, RN

## 2021-10-09 NOTE — Progress Notes (Signed)
  Echocardiogram 2D Echocardiogram has been performed.  Delcie Roch 10/09/2021, 11:11 AM

## 2021-10-09 NOTE — Procedures (Signed)
Intubation Procedure Note  Lance Spears  [id number]  [birth year]  Date:10/09/21  Time:10:08 PM   Provider Performing:Adyn Serna E Tajah Schreiner    Procedure: Intubation (31500)  Indication(s) Respiratory Failure  Consent Unable to obtain consent due to emergent nature of procedure.   Anesthesia none   Time Out Verified patient identification, verified procedure, site/side was marked, verified correct patient position, special equipment/implants available, medications/allergies/relevant history reviewed, required imaging and test results available.   Sterile Technique Usual hand hygeine, masks, and gloves were used   Procedure Description Patient positioned in bed supine.  Sedation given as noted above.  Patient was intubated with endotracheal tube using Glidescope.  View was Grade 1 full glottis .  Number of attempts was 1.  Colorimetric CO2 detector was consistent with tracheal placement.   Complications/Tolerance None; patient tolerated the procedure well. Chest X-ray is ordered to verify placement.   EBL Minimal   Specimen(s) None

## 2021-10-09 NOTE — Consult Note (Addendum)
NAME:  Felice Deem, MRN:  [id number], DOB:  [birth year], LOS: 0 ADMISSION DATE:  10/11/2021, CONSULTATION DATE:  10/21 REFERRING MD:  Dr. Marijo Conception (Code Team), CHIEF COMPLAINT:  Cardiac Arrest    History of Present Illness:  60 y/o M who presented to [facility] ER on 10/20 with reports of weakness, stumbling and loss of bowel.    Wife reports the patient had been feeling weak for several days.  They went to his PCP at [facility] with an essentially negative work up (per her report).  Per notes, he was found to have microscopic hematuria and was referred to Urology.  Wife reported on day of presentation, he lost the ability to speak, right sided weakness, was stumbling around and lost bowel function.   On presentation to the ER, he was noted to have a Hgb of 9.9.  COVID & influenza negative. Given instability / stumbling, weakness & loss of bowel function, a CT of the head was assessed which showed a right frontal anterior stroke (age indeterminate).  He was admitted for further evaluation by TRH.  The Stroke Team was consulted. In addition, he had fever to 102.7 on presentation.  Blood cultures were drawn and pending.  Subsequent MRI showed bilateral infarcts left>right and left cerebellum infarct.  MRA unremarkable.  Hgb A1c 5.7, LDL 83.  LE venous duplex negative.  ECHO demonstrated LVEF 60-65%, severe mitral valve regurgitation with prolapse of posterior MV.  Pan CT showed splenic and left kidney infarction.  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  TEE pending. On 10/21 his wife reports he was "breathing heavy during the day and had difficulty with swallowing > liquids would roll out of his mouth".  PM of 10/21 she noted he quit breathing.  The patient received 1 round of ACLS with restoration of circulation.    PCCM consulted for evaluation.       Pertinent  Medical History  None known prior to admit.   Significant Hospital Events: Including  procedures, antibiotic start and stop dates in addition to other pertinent events   10/20 Admit  10/21 Arrest on floor - noted to be apneic by wife, 1 round of CPR  Interim History / Subjective:  As above   Objective   Blood pressure 101/63, pulse (!) 120, temperature 98.7 F (37.1 C), temperature source Oral, resp. rate 20, height 5\' 9"  (1.753 m), weight 67.7 kg, SpO2 100 %.    Vent Mode: PRVC FiO2 (%):  [100 %] 100 % Set Rate:  [14 bmp] 14 bmp Vt Set:  [560 mL] 560 mL PEEP:  [5 cmH20] 5 cmH20   Intake/Output Summary (Last 24 hours) at 10/09/2021 2351 Last data filed at 10/09/2021 1714 Gross per 24 hour  Intake 398.45 ml  Output 1400 ml  Net -1001.55 ml   Filed Weights   10/09/21 10/11/21  Weight: 67.7 kg    Examination: General: ill appearing adult male lying in bed in NAD on vent  HENT: ETT, bloody secretions from mouth, pupils 59mm reactive Lungs: non-labored on vent, lungs bilaterally coarse Cardiovascular: s1s2 rrr, no m/r/g Abdomen: non distended, bsx4 hypoactive  Extremities: warm/dry, no edema Neuro: obtunded on vent, +gag, no response to stimuli GU: condom cath in place    Resolved Hospital Problem list     Assessment & Plan:   Cardiac Arrest  ?if respiratory leading to cardiac vs transformation to hemorrhagic stroke  -transfer to ICU  -levophed for MAP >65 -now CBC, BMP,  ABG   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, 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  -STAT CT head to rule out hemorrhagic conversion  -follow frequent neuro exam  -further stroke care per Neurology  -follow blood cultures  -avoid fever, hyperglycemia, hypoxia  Acute Respiratory Failure post Cardiac Arrest  -PRVC 8cc/kg  -wean PEEP / fiO2 for sats >90% -CXR now and in am  -ABG in 30 minutes  -SBT when appropriate, assess daily   Hyperlipidemia  LDL 83 -consider statin when able to take PO's   Hyponatremia  Hypokalemia  -monitor, replace  electrolytes as indicated   Anemia  Hematuria  Hematuria prior to presentation, referred to Urology  -follow CBC -transfuse for Hgb <7%, or active bleeding   Best Practice (right click and "Reselect all SmartList Selections" daily)  Diet/type: NPO DVT prophylaxis: SCD GI prophylaxis: PPI Lines: N/A Foley:  N/A Code Status:  full code Last date of multidisciplinary goals of care discussion: wife reports patient would not want prolonged support of artifical means.  Discussed that currently it is reasonable to support him to allow for assessment to determine why he arrested.  We reviewed the concept of CPR, she is going to talk with her son further.  Full code for now.   Labs   CBC: Recent Labs  Lab 10/02/2021 1241 10/14/2021 1338 10/09/21 0150 10/09/21 0533 10/09/21 2145  WBC 11.5*  --  7.4  --  16.6*  NEUTROABS 9.7*  --   --   --   --   HGB 9.9* 9.5* 8.9* 9.3* 9.5*  HCT 30.0* 28.0* 27.1* 27.4* 28.6*  MCV 90.4  --  89.1  --  89.9  PLT 143*  --  142*  --  222    Basic Metabolic Panel: Recent Labs  Lab 10/19/2021 1241 10/07/2021 1338 10/09/21 0533 10/09/21 2145  NA 131* 133* 130* 124*  K 3.5 3.5 3.2* 3.1*  CL 99 98 98 92*  CO2 23  --  21* 18*  GLUCOSE 115* 109* 111* 231*  BUN 12 11 10 9   CREATININE 0.90 0.80 0.80 0.84  CALCIUM 8.3*  --  8.1* 7.6*  MG  --   --  1.6*  --   PHOS  --   --  2.4*  --    GFR: Estimated Creatinine Clearance: 87.3 mL/min (by C-G formula based on SCr of 0.84 mg/dL). Recent Labs  Lab 09/22/2021 1241 10/09/21 0150 10/09/21 0609 10/09/21 2145 10/09/21 2246  WBC 11.5* 7.4  --  16.6*  --   LATICACIDVEN  --   --  0.7  --  1.8    Liver Function Tests: Recent Labs  Lab 09/27/2021 1241 10/09/21 0533  AST 19 18  ALT 17 16  ALKPHOS 75 70  BILITOT 0.8 1.1  PROT 5.6* 5.4*  ALBUMIN 2.6* 2.5*   No results for input(s): LIPASE, AMYLASE in the last 168 hours. No results for input(s): AMMONIA in the last 168 hours.  ABG    Component Value  Date/Time   TCO2 25 09/27/2021 1338     Coagulation Profile: Recent Labs  Lab 10/03/2021 1241 10/09/21 0533  INR 1.2 1.2    Cardiac Enzymes: Recent Labs  Lab 10/09/21 0533  CKTOTAL 27*    HbA1C: Hgb A1c MFr Bld  Date/Time Value Ref Range Status  10/09/2021 01:50 AM 5.7 (H) 4.8 - 5.6 % Final    Comment:    (NOTE) Pre diabetes:  5.7%-6.4%  Diabetes:              >6.4%  Glycemic control for   <7.0% adults with diabetes     CBG: Recent Labs  Lab 10-29-2021 1225 10/09/21 2129  GLUCAP 113* 187*    Review of Systems:   Unable to complete as patient is altered on mechanical ventilation.   Past Medical History:  He,  has no past medical history on file.   Surgical History:  History reviewed. No pertinent surgical history.   Social History:   reports that he has been smoking cigarettes. He has been smoking an average of 1 pack per day. He does not have any smokeless tobacco history on file. He reports that he does not drink alcohol and does not use drugs.   Family History:  His family history is not on file.   Allergies No Known Allergies   Home Medications  Prior to Admission medications   Medication Sig Start Date End Date Taking? Authorizing Provider  aspirin-acetaminophen-caffeine (EXCEDRIN MIGRAINE) [phone] MG tablet Take 1 tablet by mouth every 6 (six) hours as needed for headache.   Yes [provider]  Ferrous Sulfate (IRON PO) Take 1 tablet by mouth daily.   Yes [provider]     Critical care time: 37 minutes    Canary Brim, MSN, APRN, NP-C, AGACNP-BC [facility] 10/09/2021, 11:51 PM   Please see [email or web address] for pager details.   From 7A-7P if no response, please call [phone] After hours, please call Pola Corn [phone]   PCCM attending:  Is a 60 year old gentleman admitted for stroke evaluation concern for embolic disease to the head.  Unfortunately suffered cardiac arrest today on  the floor.  Appears to have a brief respiratory mediated event.  However after obtaining stat head CT patient actually suffered a devastating intracranial hemorrhage.  This does not appear to be sustainable with life.  We have called and discussed case with neurosurgery.  We appreciate their input.  BP (!) 145/85   Pulse 66   Temp 98.7 F (37.1 C) (Oral)   Resp (!) 21   Ht 5\' 9"  (1.753 m)   Wt 67.7 kg   SpO2 100%   BMI 22.04 kg/m   Elderly male intubated on mechanical life support Heart: Regular rhythm S1-S2 Lungs: Bilateral mechanically ventilated breath sounds Abdomen: Soft nontender nondistended Neuro: Initially seen post intubation  Head CT: Intracranial hemorrhage midline shift  Assessment: Acute intracranial hemorrhage, midline shift, unlikely to survive intervention after discussion with neurosurgery Admission for CVA, likely bilateral cerebral hemispheric and left cerebellar infarct 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 She she has already et are expressed concerned about going on mechanical support and stating that he would not want this for a prolonged indication Likely plans for palliative transition in the ICU  This patient is critically ill with multiple organ system failure; which, requires frequent high complexity decision making, assessment, support, evaluation, and titration of therapies. This was completed through the application of advanced monitoring technologies and extensive interpretation of multiple databases. During this encounter critical care time was devoted to patient care services described in this note for 34 minutes.   , DO [facility] 10/02/2021 12:14 AM

## 2021-10-09 NOTE — Progress Notes (Signed)
Neurosurgery  I was asked to review the CT scan in this 60 yo M with endocarditis and recent embolic strokes who recently coded.  He has a large hypertensive hemorrhage centered in the basal ganglia, with IVH and extension into his temporal lobe.  There is no neurosurgical intervention that would offer benefit to the patient.  Would recommend supportive care for now but given the size of his hemorrhagic stroke with certainty of permanent severe morbidity/neurologic deficit and high probability of eventual lethality, palliative care consult would be appropriate.

## 2021-10-09 NOTE — Evaluation (Addendum)
Physical Therapy Evaluation Patient Details Name: Lance Spears MRN: [id number] DOB: 1961/10/29 Today's Date: 10/09/2021  History of Present Illness  Patient is a 60 y/o male who presents on 10-22-21 with right sided weakness, vertigo, near syncope and aphasia with reports of sliding down a wall at home. Brain MRI- scattered small foci in bilateral hemispheres and left cerebellum. Also with Acute metabolic encephalopathy. PMH includes BPH and urinary retention with an indwelling Foley catheter, hematuria.  Clinical Impression  Patient presents with lethargy, right hemiplegia, impaired balance and impaired mobility s/p above. History/PLOF and home setup provided by son present in room. Pt requires Max-total A for bed mobility and sitting balance with pt attempting to pull self forward using LUE on bed and trying to throw self towards right (possibly trying to lay down the wrong way). Minimal to no verbalizations during session. Not following any commands and no response to noxious stimulus. Decreased level of arousal preventing a more thorough assessment. Would benefit from intensive rehab to maximize independence and mobility prior to return home. Will continue to assess once arousal/attention improves and make changes to recommendations as needed.      Recommendations for follow up therapy are one component of a multi-disciplinary discharge planning process, led by the attending physician.  Recommendations may be updated based on patient status, additional functional criteria and insurance authorization.  Follow Up Recommendations CIR    Equipment Recommendations  Other (comment) (defer to CIR)    Recommendations for Other Services Rehab consult     Precautions / Restrictions Precautions Precautions: Fall Restrictions Weight Bearing Restrictions: No      Mobility  Bed Mobility Overal bed mobility: Needs Assistance Bed Mobility: Supine to Sit;Sit to Supine     Supine to sit: Total  assist;+2 for physical assistance;+2 for safety/equipment;HOB elevated Sit to supine: Mod assist;HOB elevated   General bed mobility comments: total A using pads to get pt to EOB; pt able to lower trunk down into pillow, assist with LEs into bed.    Transfers                 General transfer comment: Deferred  Ambulation/Gait             General Gait Details: Unable  Stairs            Wheelchair Mobility    Modified Rankin (Stroke Patients Only) Modified Rankin (Stroke Patients Only) Pre-Morbid Rankin Score: No symptoms Modified Rankin: Severe disability     Balance Overall balance assessment: Needs assistance Sitting-balance support: Feet supported;Single extremity supported Sitting balance-Leahy Scale: Zero Sitting balance - Comments: Requires Max A for sitting balance with moments of Mod A with pt using LUE to pull trunk forward on bed; pushing self to right side (likely attempting to lay down) Forward head posture and not able to sustain holding up neck without support. Postural control: Posterior lean;Right lateral lean                                   Pertinent Vitals/Pain Pain Assessment: Faces Faces Pain Scale: No hurt    Home Living Family/patient expects to be discharged to:: Private residence Living Arrangements: Spouse/significant other Available Help at Discharge: Family;Available PRN/intermittently Type of Home: House Home Access: Level entry     Home Layout: One level        Prior Function Level of Independence: Independent  Comments: Retired Curator, drives.     Hand Dominance   Dominant Hand: Right    Extremity/Trunk Assessment   Upper Extremity Assessment Upper Extremity Assessment: Defer to OT evaluation    Lower Extremity Assessment Lower Extremity Assessment: RLE deficits/detail RLE Deficits / Details: No active movement noted. No response to noxious stimulus. RLE Sensation: decreased  light touch RLE Coordination: decreased fine motor;decreased gross motor       Communication   Communication: No difficulties  Cognition Arousal/Alertness: Lethargic Behavior During Therapy: Restless Overall Cognitive Status: Difficult to assess                                 General Comments: Pt lethargic and kept eyes closed for most of session. Not following any commands today. The only verbalization noted was "yes" to correct names of son/wife.      General Comments General comments (skin integrity, edema, etc.): Son present but left during session to get wife.    Exercises     Assessment/Plan    PT Assessment Patient needs continued PT services  PT Problem List Decreased strength;Decreased range of motion;Decreased mobility;Decreased safety awareness;Impaired tone;Decreased coordination;Decreased activity tolerance;Decreased cognition;Impaired sensation;Decreased balance;Decreased knowledge of use of DME       PT Treatment Interventions Therapeutic exercise;DME instruction;Wheelchair mobility training;Neuromuscular re-education;Patient/family education;Therapeutic activities;Functional mobility training;Cognitive remediation;Gait training;Modalities;Manual techniques;Balance training    PT Goals (Current goals can be found in the Care Plan section)  Acute Rehab PT Goals Patient Stated Goal: none stated PT Goal Formulation: Patient unable to participate in goal setting Time For Goal Achievement: 10/23/21 Potential to Achieve Goals: Fair    Frequency Min 4X/week   Barriers to discharge        Co-evaluation PT/OT/SLP Co-Evaluation/Treatment: Yes Reason for Co-Treatment: Necessary to address cognition/behavior during functional activity;For patient/therapist safety;To address functional/ADL transfers PT goals addressed during session: Mobility/safety with mobility;Balance;Strengthening/ROM         AM-PAC PT "6 Clicks" Mobility  Outcome Measure Help  needed turning from your back to your side while in a flat bed without using bedrails?: Total Help needed moving from lying on your back to sitting on the side of a flat bed without using bedrails?: Total Help needed moving to and from a bed to a chair (including a wheelchair)?: Total Help needed standing up from a chair using your arms (e.g., wheelchair or bedside chair)?: Total Help needed to walk in hospital room?: Total Help needed climbing 3-5 steps with a railing? : Total 6 Click Score: 6    End of Session   Activity Tolerance: Patient limited by lethargy Patient left: in bed;with call bell/phone within reach;with bed alarm set;with SCD's reapplied Nurse Communication: Mobility status PT Visit Diagnosis: Hemiplegia and hemiparesis Hemiplegia - Right/Left: Right Hemiplegia - dominant/non-dominant: Dominant Hemiplegia - caused by: Cerebral infarction    Time: 1415 [id number] PT Time Calculation (min) (ACUTE ONLY): 15 min   Charges:   PT Evaluation $PT Eval Moderate Complexity: 1 Mod          Vale Haven, PT, DPT Acute Rehabilitation Services Pager [phone] Office [phone]     Blake Divine A Lanier Ensign 10/09/2021, 3:40 PM

## 2021-10-09 NOTE — Progress Notes (Signed)
Cross-coverage note:   Patient admitted yesterday with stroke has developed a fever. There was a mild leukocytosis on admission but normal WBC this am, no evidence for infection on CXR or CT abd/pelvis, and negative COVID and influenza pcr. Plan to treat fever and culture blood and urine.

## 2021-10-09 NOTE — Evaluation (Signed)
Occupational Therapy Evaluation Patient Details Name: Lance Spears MRN: [id number] DOB: 1961-04-03 Today's Date: 10/09/2021   History of Present Illness Patient is a 60 y/o male who presents on 09/21/2021 with right sided weakness, vertigo, near syncope and aphasia with reports of sliding down a wall at home. Brain MRI- scattered small foci in bilateral hemispheres and left cerebellum. Also with Acute metabolic encephalopathy. PMH includes BPH and urinary retention with an indwelling Foley catheter, hematuria.   Clinical Impression   Pt admitted for concerns listed above. PTA pt's son reported that he was independent with all ADL's and IADL's. At this time, pt presented with increased lethargy, R hemiplegia, impaired balance/mobility, and potential cognitive and visual deficits.  Pt requires Max-total A for bed mobility and sitting balance with pt attempting to pull self forward using LUE on bed and trying to throw self towards right (possibly trying to lay down the wrong way). Pt will require a more thorough assessment as level of arousal increases. Recommend SNF level therapies to maximize pt return to PLOF. OT will follow acutely.      Recommendations for follow up therapy are one component of a multi-disciplinary discharge planning process, led by the attending physician.  Recommendations may be updated based on patient status, additional functional criteria and insurance authorization.   Follow Up Recommendations  SNF    Equipment Recommendations  Other (comment) (TBD)    Recommendations for Other Services       Precautions / Restrictions Precautions Precautions: Fall Restrictions Weight Bearing Restrictions: No      Mobility Bed Mobility Overal bed mobility: Needs Assistance Bed Mobility: Supine to Sit;Sit to Supine     Supine to sit: Total assist;+2 for physical assistance;+2 for safety/equipment;HOB elevated Sit to supine: Mod assist;HOB elevated   General bed mobility  comments: total A using pads to get pt to EOB; pt able to lower trunk down into pillow, assist with LEs into bed.    Transfers                 General transfer comment: Deferred    Balance Overall balance assessment: Needs assistance Sitting-balance support: Feet supported;Single extremity supported Sitting balance-Leahy Scale: Zero Sitting balance - Comments: Requires Max A for sitting balance with moments of Mod A with pt using LUE to pull trunk forward on bed; pushing self to right side (likely attempting to lay down) Forward head posture and not able to sustain holding up neck without support. Postural control: Posterior lean;Right lateral lean                                 ADL either performed or assessed with clinical judgement   ADL Overall ADL's : Needs assistance/impaired                                       General ADL Comments: Pt at this time requiring max-total A for all ADL's and mobility due to increased lethargy and flaccidity on R side, as well as potential cog deficits.     Vision Baseline Vision/History: 1 Wears glasses Ability to See in Adequate Light: 0 Adequate Patient Visual Report: No change from baseline Vision Assessment?: Vision impaired- to be further tested in functional context Additional Comments: Pt not looking at therapist, following stimulous     Perception Perception Perception Tested?: No  Praxis Praxis Praxis tested?: Not tested    Pertinent Vitals/Pain Pain Assessment: No/denies pain Faces Pain Scale: No hurt Pain Intervention(s): Monitored during session     Hand Dominance Right   Extremity/Trunk Assessment Upper Extremity Assessment Upper Extremity Assessment: RUE deficits/detail RUE Deficits / Details: No active movement noted. No response to noxious stimulus. RUE Sensation: decreased light touch;decreased proprioception RUE Coordination: decreased fine motor;decreased gross motor    Lower Extremity Assessment Lower Extremity Assessment: Defer to PT evaluation RLE Deficits / Details: No active movement noted. No response to noxious stimulus. RLE Sensation: decreased light touch RLE Coordination: decreased fine motor;decreased gross motor       Communication Communication Communication: Expressive difficulties   Cognition Arousal/Alertness: Lethargic Behavior During Therapy: Restless Overall Cognitive Status: Difficult to assess                                 General Comments: Pt lethargic and kept eyes closed for most of session. Not following any commands today. The only verbalization noted was "yes" to correct names of son/wife.   General Comments  Son present but left during session to get wife.    Exercises     Shoulder Instructions      Home Living Family/patient expects to be discharged to:: Private residence Living Arrangements: Spouse/significant other Available Help at Discharge: Family;Available PRN/intermittently Type of Home: House Home Access: Level entry     Home Layout: One level                   Additional Comments: Limited as pt was unable to participate in answering questions and son was unsure of home set up  Lives With: Spouse    Prior Functioning/Environment Level of Independence: Independent        Comments: Retired Curator, drives.        OT Problem List: Decreased strength;Decreased range of motion;Decreased activity tolerance;Impaired balance (sitting and/or standing);Impaired vision/perception;Decreased coordination;Decreased cognition;Decreased safety awareness;Decreased knowledge of use of DME or AE;Impaired sensation;Impaired tone;Impaired UE functional use      OT Treatment/Interventions: Self-care/ADL training;Therapeutic exercise;Energy conservation;DME and/or AE instruction;Therapeutic activities;Cognitive remediation/compensation;Visual/perceptual  remediation/compensation;Patient/family education;Balance training;Neuromuscular education    OT Goals(Current goals can be found in the care plan section) Acute Rehab OT Goals Patient Stated Goal: none stated OT Goal Formulation: Patient unable to participate in goal setting Time For Goal Achievement: 10/09/21 Potential to Achieve Goals: Fair  OT Frequency: Min 2X/week   Barriers to D/C:            Co-evaluation PT/OT/SLP Co-Evaluation/Treatment: Yes Reason for Co-Treatment: Necessary to address cognition/behavior during functional activity;For patient/therapist safety;To address functional/ADL transfers PT goals addressed during session: Mobility/safety with mobility;Balance;Strengthening/ROM OT goals addressed during session: Strengthening/ROM;ADL's and self-care      AM-PAC OT "6 Clicks" Daily Activity     Outcome Measure Help from another person eating meals?: A Lot Help from another person taking care of personal grooming?: A Lot Help from another person toileting, which includes using toliet, bedpan, or urinal?: Total Help from another person bathing (including washing, rinsing, drying)?: Total Help from another person to put on and taking off regular upper body clothing?: Total Help from another person to put on and taking off regular lower body clothing?: Total 6 Click Score: 8   End of Session Nurse Communication: Mobility status  Activity Tolerance: Patient limited by lethargy Patient left: in bed;with call bell/phone within reach;with bed alarm set  OT Visit Diagnosis: Unsteadiness on feet (R26.81);Other abnormalities of gait and mobility (R26.89);Muscle weakness (generalized) (M62.81);Hemiplegia and hemiparesis Hemiplegia - Right/Left: Right Hemiplegia - dominant/non-dominant: Dominant Hemiplegia - caused by: Cerebral infarction                Time: 1415 [phone] OT Time Calculation (min): 15 min Charges:  OT General Charges $OT Visit: 1 Visit OT  Evaluation $OT Eval Moderate Complexity: 1 Mod  Atalia Litzinger H., OTR/L Acute Rehabilitation  Adreena Willits Elane Bing Plume 10/09/2021, 4:34 PM

## 2021-10-09 NOTE — Plan of Care (Signed)
  Problem: Ischemic Stroke/TIA Tissue Perfusion: Goal: Complications of ischemic stroke/TIA will be minimized Outcome: Progressing   

## 2021-10-09 NOTE — Progress Notes (Signed)
Chaplain responded to Code Blue. Patient's wife in the hallway, tearful, shaking.  She noticed that her husband had stopped breathing and called the nurse. Chaplain provided ministry of presence and prayer during Code.  And then escorted her to the 4th floor and remained with her while her husband was attended by new staff.  Chaplain provided her refreshments and was concerned because she had not slept or eaten in 24 hours.  Patient's wife contacted her sons after PA asked her about DNR.  "I want to ask them how they feel."   Please call chaplain again if more support is needed. Rev. Lynnell Chad Pager [phone]

## 2021-10-10 LAB — POCT I-STAT 7, (LYTES, BLD GAS, ICA,H+H)
Acid-Base Excess: 1 mmol/L (ref 0.0–2.0)
Bicarbonate: 25.3 mmol/L (ref 20.0–28.0)
Calcium, Ion: 1.08 mmol/L — ABNORMAL LOW (ref 1.15–1.40)
HCT: 28 % — ABNORMAL LOW (ref 39.0–52.0)
Hemoglobin: 9.5 g/dL — ABNORMAL LOW (ref 13.0–17.0)
O2 Saturation: 100 %
Patient temperature: 98.8
Potassium: 3.2 mmol/L — ABNORMAL LOW (ref 3.5–5.1)
Sodium: 129 mmol/L — ABNORMAL LOW (ref 135–145)
TCO2: 26 mmol/L (ref 22–32)
pCO2 arterial: 39 mmHg (ref 32.0–48.0)
pH, Arterial: 7.421 (ref 7.350–7.450)
pO2, Arterial: 481 mmHg — ABNORMAL HIGH (ref 83.0–108.0)

## 2021-10-10 LAB — BLOOD CULTURE ID PANEL (REFLEXED) - BCID2

## 2021-10-10 LAB — BASIC METABOLIC PANEL
Anion gap: 13 (ref 5–15)
BUN: 10 mg/dL (ref 8–23)
CO2: 19 mmol/L — ABNORMAL LOW (ref 22–32)
Calcium: 7.7 mg/dL — ABNORMAL LOW (ref 8.9–10.3)
Chloride: 98 mmol/L (ref 98–111)
Creatinine, Ser: 0.75 mg/dL (ref 0.61–1.24)
GFR, Estimated: >60 mL/min (ref >60)
Glucose, Bld: 260 mg/dL — ABNORMAL HIGH (ref 70–99)
Potassium: 3.5 mmol/L (ref 3.5–5.1)
Sodium: 130 mmol/L — ABNORMAL LOW (ref 135–145)

## 2021-10-10 LAB — MRSA NEXT GEN BY PCR, NASAL: MRSA by PCR Next Gen: NOT DETECTED

## 2021-10-10 LAB — TROPONIN I (HIGH SENSITIVITY): Troponin I (High Sensitivity): 120 ng/L (ref <18)

## 2021-10-10 LAB — LACTIC ACID, PLASMA: Lactic Acid, Venous: 1.3 mmol/L (ref 0.5–1.9)

## 2021-10-10 MED ORDER — ACETAMINOPHEN 650 MG RE SUPP: 650.0000 mg | Freq: Four times a day (QID) | RECTAL | Status: DC | PRN

## 2021-10-10 MED ORDER — GLYCOPYRROLATE 0.2 MG/ML IJ SOLN: 0.2000 mg | INTRAMUSCULAR | Status: DC | PRN

## 2021-10-10 MED ORDER — MORPHINE 100MG IN NS 100ML (1MG/ML) PREMIX INFUSION: 0.0000 mg/h | INTRAVENOUS | Status: DC

## 2021-10-10 MED ORDER — MORPHINE 100MG IN NS 100ML (1MG/ML) PREMIX INFUSION
0.0000 mg/h | INTRAVENOUS | Status: DC
  Administered 2021-10-10: 5 mg/h via INTRAVENOUS
  Filled 2021-10-10: qty 100

## 2021-10-10 MED ORDER — GLYCOPYRROLATE 1 MG PO TABS: 1.0000 mg | ORAL_TABLET | ORAL | Status: DC | PRN

## 2021-10-10 MED ORDER — DIPHENHYDRAMINE HCL 50 MG/ML IJ SOLN: 25.0000 mg | INTRAMUSCULAR | Status: DC | PRN

## 2021-10-10 MED ORDER — POLYVINYL ALCOHOL 1.4 % OP SOLN: 1.0000 [drp] | Freq: Four times a day (QID) | OPHTHALMIC | Status: DC | PRN

## 2021-10-10 MED ORDER — MORPHINE SULFATE (PF) 2 MG/ML IV SOLN: 2.0000 mg | INTRAVENOUS | Status: DC | PRN

## 2021-10-10 MED ORDER — ONDANSETRON 4 MG PO TBDP: 4.0000 mg | ORAL_TABLET | Freq: Four times a day (QID) | ORAL | Status: DC | PRN

## 2021-10-10 MED ORDER — ONDANSETRON HCL 4 MG/2ML IJ SOLN: 4.0000 mg | Freq: Four times a day (QID) | INTRAMUSCULAR | Status: DC | PRN

## 2021-10-10 MED ORDER — GLYCOPYRROLATE 1 MG PO TABS
1.0000 mg | ORAL_TABLET | ORAL | Status: DC | PRN
  Filled 2021-10-10: qty 1

## 2021-10-10 MED ORDER — ACETAMINOPHEN 325 MG PO TABS: 650.0000 mg | ORAL_TABLET | Freq: Four times a day (QID) | ORAL | Status: DC | PRN

## 2021-10-10 MED ORDER — MORPHINE SULFATE (PF) 2 MG/ML IV SOLN
2.0000 mg | INTRAVENOUS | Status: DC | PRN
  Administered 2021-10-10: 4 mg via INTRAVENOUS
  Filled 2021-10-10: qty 2

## 2021-10-10 MED ORDER — DEXTROSE 5 % IV SOLN: INTRAVENOUS | Status: DC

## 2021-10-10 MED ORDER — POLYVINYL ALCOHOL 1.4 % OP SOLN
1.0000 [drp] | Freq: Four times a day (QID) | OPHTHALMIC | Status: DC | PRN
  Filled 2021-10-10: qty 15

## 2021-10-10 MED ORDER — MORPHINE BOLUS VIA INFUSION
5.0000 mg | INTRAVENOUS | Status: DC | PRN
  Filled 2021-10-10: qty 5

## 2021-10-12 LAB — CULTURE, BLOOD (ROUTINE X 2)
Special Requests: ADEQUATE
Special Requests: ADEQUATE

## 2021-10-20 NOTE — Progress Notes (Addendum)
   10/09/21 2010  Assess: MEWS Score  Level of Consciousness Responds to Pain  Assess: MEWS Score  MEWS Temp 0  MEWS Systolic 0  MEWS Pulse 0  MEWS RR 1  MEWS LOC 2  MEWS Score 3  MEWS Score Color Yellow  Assess: if the MEWS score is Yellow or Red  Were vital signs taken at a resting state? Yes  Focused Assessment Change from prior assessment (see assessment flowsheet)  Early Detection of Sepsis Score *See Row Information* Low  MEWS guidelines implemented *See Row Information* Yes  Treat  MEWS Interventions Administered scheduled meds/treatments  Breathing 1  Negative Vocalization 0  Facial Expression 0  Body Language 1  Consolability 0  PAINAD Score 2  Take Vital Signs  Increase Vital Sign Frequency  Yellow: Q 2hr X 2 then Q 4hr X 2, if remains yellow, continue Q 4hrs  Escalate  MEWS: Escalate Yellow: discuss with charge nurse/RN and consider discussing with provider and RRT  Notify: Charge Nurse/RN  Name of Charge Nurse/RN Notified Phil RN  Date Charge Nurse/RN Notified 10/09/21  Time Charge Nurse/RN Notified 2028  Notify: Provider  Provider Name/Title Opyd MD  Date Provider Notified 10/09/21  Time Provider Notified 2021  Notification Type Page  Notification Reason Change in status (yellow mews and increased lathargy)  Provider response Other (Comment) (CTM; if worsening RR status or SpO2 desaturation; contact back)  Date of Provider Response 10/09/21  Time of Provider Response 2031  Document  Patient Outcome Other (Comment) (CTM; stable for now)   2103: Pt's wife updated and call out if she noticed any change in his behavior or breathing pattern or work of breathing.

## 2021-10-20 NOTE — Progress Notes (Signed)
Called by Radiology regarding CT findings of large hemorrhage filling lateral 3rd / 4th ventricle with enlargement, additional SAH, > 75mm left to right shift.    Reviewed findings with Neurosurgery (Dr. Maisie Fus) and he is not a surgical candidate.  Concern for devastating event.    Lance Brim, MSN, APRN, NP-C, AGACNP-BC [facility] 2021/10/15, 12:10 AM   Please see [email or web address] for pager details.   From 7A-7P if no response, please call [phone] After hours, please call ELink [phone]

## 2021-10-20 NOTE — Death Summary Note (Signed)
DEATH SUMMARY   Patient Details  Name: Lance Spears MRN: [id number] DOB: [birth year]  Admission/Discharge Information   Admit Date:  10/16/2021  Date of Death: Date of Death: 2021/10/18  Time of Death: Time of Death: 0417  Length of Stay: 1  Referring Physician: Collene Leyden, MD   Reason(s) for Hospitalization   60 y/o M who presented to [facility] ER on October 17, 2023 with reports of weakness, stumbling and loss of bowel.    Wife reports the patient had been feeling weak for several days.  They went to his PCP at [facility] with an essentially negative work up (per her report).  Per notes, he was found to have microscopic hematuria and was referred to Urology.  Wife reported on day of presentation, he lost the ability to speak, right sided weakness, was stumbling around and lost bowel function.  Diagnoses  Preliminary cause of death: ICH (intracerebral hemorrhage) ([place]) Secondary Diagnoses (including complications and co-morbidities):  Active Problems:   Stroke (cerebrum) (HCC)   CVA (cerebral vascular accident) [facility])   [place] Hospital Course (including significant findings, care, treatment, and services provided and events leading to death)  Lance Spears is a 60 y.o. year old male who 60 y/o M who presented to [facility] ER on 60 y/o M who presented of weakness, stumbling and loss of bowel.    Wife reports the patient had been feeling weak for several days.  They went to his PCP at [facility] with an essentially negative work up (per her report).  Per notes, he was found to have microscopic hematuria and was referred to Urology.  Wife reported on day of presentation, he lost the ability to speak, right sided weakness, was stumbling around and lost bowel function.    On presentation to the ER, he was noted to have a Hgb of 9.9.  COVID & influenza negative. Given instability / stumbling, weakness & loss of bowel function, a CT of the head was assessed which showed a right frontal anterior stroke (age  indeterminate).  He was admitted for further evaluation by [facility].  The Stroke Team was consulted. In addition, he had fever to 102.7 on presentation.  Blood cultures were drawn and pending.  Subsequent MRI showed bilateral infarcts left>right and left cerebellum infarct.  MRA unremarkable.  Hgb A1c 5.7, LDL 83.  LE venous duplex negative.  ECHO demonstrated LVEF 60-65%, severe mitral valve regurgitation with prolapse of posterior MV.  Pan CT showed splenic and left kidney infarction.  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  TEE pending. On 10/21 his wife reports he was "breathing heavy during the day and had difficulty with swallowing > liquids would roll out of his mouth".  PM of 10/21 she noted he quit breathing.  The patient received 1 round of ACLS with restoration of circulation.     PCCM consulted for evaluation.      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  -STAT CT head to rule out hemorrhagic conversion  -follow frequent neuro exam  -further stroke care per Neurology  -follow blood cultures  -avoid fever, hyperglycemia, hypoxia   Acute Respiratory Failure post Cardiac Arrest  -PRVC 8cc/kg  -wean PEEP / fiO2 for sats >90% -CXR now and in am  -ABG in 30 minutes  -  SBT when appropriate, assess daily    Hyperlipidemia  LDL 83 -consider statin when able to take PO's    Hyponatremia  Hypokalemia  -monitor, replace electrolytes as indicated    Anemia  Hematuria  Hematuria prior to presentation, referred to Urology  -follow CBC -transfuse for Hgb <7%, or active bleeding   Ultimately, the patient was found to have hemorrhagic transformation with an intracranial hemorrhage likely due to suspected multiple embolic strokes.   He was deemed not an operative candidate.  We discussed goals of care with patient's family at bedside.  They made the decision for comfort care withdrawal.  Patient's comfort measures were placed in the orders.  Patient was palliatively extubated and passed peacefully in the intensive care unit.  Pertinent Labs and Studies  Significant Diagnostic Studies CT ABDOMEN PELVIS WO CONTRAST  Result Date: 09/27/2021 CLINICAL DATA:  Hematuria, unknown cause EXAM: CT ABDOMEN AND PELVIS WITHOUT CONTRAST TECHNIQUE: Multidetector CT imaging of the abdomen and pelvis was performed following the standard protocol without IV contrast. COMPARISON:  None. FINDINGS: Lower chest: Lung bases are clear. Small mucoid material in the RIGHT mainstem bronchus. Linear pleuroparenchymal thickening at the RIGHT lung base suggests atelectasis. Hepatobiliary: No focal hepatic lesion. No biliary duct dilatation. Common bile duct is normal. Pancreas: Pancreas is normal. No ductal dilatation. No pancreatic inflammation. Spleen: Normal spleen Adrenals/urinary tract: Adrenal glands are normal. No nephrolithiasis or ureterolithiasis. No obstructive uropathy. Vascular calcifications in the renal hila. No bladder calculi. Stomach/Bowel: Stomach, small-bowel and cecum are normal. The appendix is not identified but there is no pericecal inflammation to suggest appendicitis. The colon and rectosigmoid colon are normal. Vascular/Lymphatic: Abdominal aorta is normal caliber. No periportal or retroperitoneal adenopathy. No pelvic adenopathy. Reproductive: Prostate unremarkable Other: No free fluid. Musculoskeletal: No aggressive osseous lesion. IMPRESSION: 1. No nephrolithiasis, ureterolithiasis or obstructive uropathy. 2.  Aortic Atherosclerosis (ICD10-I70.0). Electronically Signed   By: Suzy Bouchard M.D.   On: 10/03/2021 14:26   DG Chest 2 View  Result Date: 10/01/2021 CLINICAL DATA:  60 year old male with cough EXAM: CHEST - 2 VIEW  COMPARISON:  12/28/2011 FINDINGS: Cardiomediastinal silhouette unchanged in size and contour. No evidence of central vascular congestion. No interlobular septal thickening. Mild bronchial wall thickening. Mild reticular opacities in the infrahilar regions greater on the right. No pneumothorax or pleural effusion. Coarsened interstitial markings, with no confluent airspace disease. No acute displaced fracture. Degenerative changes of the spine. Scoliotic curvature of the spine. IMPRESSION: Favored chronic lung changes without evidence of acute cardiopulmonary disease. Note that low-dose CT lung cancer screening is recommended for patients who are 53-85 years of age with a 30+ pack-year history of smoking, and who are currently smoking or quit <=15 years ago. Electronically Signed   By: Corrie Mckusick D.O.   On: 10/01/2021 08:24   CT HEAD WO CONTRAST (5MM)  Result Date: 10/09/2021 CLINICAL DATA:  Mental status change, unknown cause EXAM: CT HEAD WITHOUT CONTRAST TECHNIQUE: Contiguous axial images were obtained from the base of the skull through the vertex without intravenous contrast. COMPARISON:  09/28/2021 FINDINGS: Brain: Large volume of intraparenchymal hemorrhage centered in the left lentiform nucleus, external capsule, and medial temporal lobe, measuring up to 6.6 x 3.6 x 4.3 cm (series 3, image 21 and series 5, image 39), with additional subarachnoid hemorrhage within the left cerebral sulci. Surrounding hypodensity, likely edema, extending throughout the left temporal lobe and into the left frontal and parietal lobes. There is associated mass effect with approximately 10 mm of left  to right midline shift. Additional hemorrhage in the bilateral lateral, third and fourth ventricles. There is mass effect on the left lateral ventricle and third ventricle. The fourth ventricle has increased in size since the prior exam. Vascular: No hyperdense vessel. Skull: Normal. Negative for fracture or focal lesion.  Sinuses/Orbits: No acute finding. Other: The mastoids are well aerated. IMPRESSION: 1. Large volume intraparenchymal hemorrhage in the left cerebral hemisphere, with additional subarachnoid hemorrhage in the left cerebral sulci and intraventricular hemorrhage. 2. Surrounding edema and associated mass effect causes approximately 10 mm of left-to-right midline shift, with mass effect on the left lateral and third ventricle. Increased size of the fourth ventricle is concerning for obstruction. These results were called by telephone at the time of interpretation on 10/09/2021 at 11:28 pm to provider Canary Brim , who verbally acknowledged these results. Electronically Signed   By: Wiliam Ke M.D.   On: 10/09/2021 23:30   CT HEAD WO CONTRAST  Result Date: 10/05/2021 CLINICAL DATA:  Neuro deficit, acute, stroke suspected EXAM: CT HEAD WITHOUT CONTRAST TECHNIQUE: Contiguous axial images were obtained from the base of the skull through the vertex without intravenous contrast. COMPARISON:  None. FINDINGS: Brain: Asymmetric hypodensities within the anterior right frontal white matter, concerning for age indeterminate infarct. Additional patchy white matter hypodensities are nonspecific but compatible with mild chronic microvascular ischemic disease. No evidence of acute hemorrhage, mass lesion, abnormal mass effect, or hydrocephalus. Vascular: No hyperdense vessel identified. Calcific intracranial atherosclerosis. Skull: No acute fracture. Sinuses/Orbits: Visualized sinuses are largely clear. Partially imaged mucosal thickening in the inferior left maxillary sinus. Unremarkable orbits. Other: No mastoid effusions.  The IMPRESSION: Findings concerning for age indeterminate infarct in the anterior right frontal white matter, potentially acute or subacute. Recommend MRI to further evaluate. Electronically Signed   By: Feliberto Harts M.D.   On: 10/07/2021 14:21   MR ANGIO HEAD WO CONTRAST  Result Date:  09/23/2021 CLINICAL DATA:  Neuro deficit, acute, stroke suspected stroke EXAM: MRA HEAD WITHOUT CONTRAST TECHNIQUE: Angiographic images of the Circle of Willis were acquired using MRA technique without intravenous contrast. COMPARISON:  No pertinent prior exam. FINDINGS: Anterior circulation: Intracranial internal carotid arteries are patent. Anterior and middle cerebral arteries are patent. Posterior circulation: Included intracranial vertebral arteries are patent. Basilar artery is patent. Major cerebellar artery origins are patent. Bilateral posterior communicating arteries are present. There is fetal origin of the left PCA. Posterior cerebral arteries are patent. Other: No aneurysm. IMPRESSION: No proximal intracranial vessel occlusion or significant stenosis. Electronically Signed   By: Guadlupe Spanish M.D.   On: 10/12/2021 16:57   MR Angiogram Neck W or Wo Contrast  Result Date: 10/07/2021 CLINICAL DATA:  Neuro deficit, acute, stroke suspected EXAM: MRA NECK WITHOUT AND WITH CONTRAST TECHNIQUE: Multiplanar and multiecho pulse sequences of the neck were obtained without and with intravenous contrast. Angiographic images of the neck were obtained using MRA technique without and with intravenous contrast. CONTRAST:  7.7mL GADAVIST GADOBUTROL 1 MMOL/ML IV SOLN COMPARISON:  None. FINDINGS: Great vessel origins are patent. Common, internal, and external carotid arteries are patent. There is no significant stenosis. Codominant extracranial vertebral arteries are patent.  No stenosis. IMPRESSION: Patent carotid and vertebral arteries without stenosis. Electronically Signed   By: Guadlupe Spanish M.D.   On: 10/17/2021 18:04   MR BRAIN WO CONTRAST  Result Date: 09/29/2021 CLINICAL DATA:  Neuro deficit, acute, stroke suspected EXAM: MRI HEAD WITHOUT CONTRAST TECHNIQUE: Multiplanar, multiecho pulse sequences of the brain and surrounding structures  were obtained without intravenous contrast. COMPARISON:  None.  FINDINGS: Brain: There are scattered small foci of restricted diffusion within the cerebral hemispheres bilaterally involving frontoparietal lobes as well as the left insula. Small focus of restricted diffusion within the peripheral left cerebellum. Chronic infarcts of the anterior right frontal white matter. Additional patchy foci of T2 hyperintensity in the supratentorial white matter are nonspecific but may reflect mild chronic microvascular ischemic changes. There are curvilinear foci of sulcal susceptibility likely reflecting sequelae of prior subarachnoid hemorrhage. There is no intracranial mass or mass effect. There is no hydrocephalus or extra-axial fluid collection. Vascular: Major vessel flow voids at the skull base are preserved. Skull and upper cervical spine: Normal marrow signal is preserved. Sinuses/Orbits: Minor mucosal thickening.  Orbits are unremarkable. Other: Sella is unremarkable.  Mastoid air cells are clear. IMPRESSION: Small acute infarcts of bilateral cerebral hemispheres and left cerebellum. Chronic microvascular ischemic changes and additional chronic infarcts. Evidence of prior subarachnoid hemorrhage. Electronically Signed   By: Macy Mis M.D.   On: 10/09/2021 17:11   CT CHEST ABDOMEN PELVIS W CONTRAST  Result Date: 10/09/2021 CLINICAL DATA:  Stroke, follow up. Hematuria pt unable to drink contrast. In total only drank 1/4 of one bottle. EXAM: CT CHEST, ABDOMEN, AND PELVIS WITH CONTRAST TECHNIQUE: Multidetector CT imaging of the chest, abdomen and pelvis was performed following the standard protocol during bolus administration of intravenous contrast. CONTRAST:  154mL OMNIPAQUE IOHEXOL 300 MG/ML  SOLN COMPARISON:  CT abdomen pelvis 10/17/2021. FINDINGS: Limited evaluation due to respiratory motion artifact. CT CHEST FINDINGS Cardiovascular: Normal heart size. No significant pericardial effusion. The thoracic aorta is normal in caliber. Mild atherosclerotic plaque of the  thoracic aorta. At least 3 vessel coronary artery calcifications. Mediastinum/Nodes: No enlarged mediastinal, hilar, or axillary lymph nodes. Thyroid gland, trachea, and esophagus demonstrate no significant findings. Lungs/Pleura: At least mild to moderate centrilobular emphysematous changes. Right lower lobe subsegmental atelectasis. No pulmonary nodule. No pulmonary mass. No pleural effusion. No pneumothorax. Musculoskeletal: No chest wall abnormality. No suspicious lytic or blastic osseous lesions. No acute displaced fracture. Multilevel degenerative changes of the spine. CT ABDOMEN PELVIS FINDINGS Hepatobiliary: Subcentimeter hypodensity too small to characterize. No focal liver abnormality. No gallstones, gallbladder wall thickening, or pericholecystic fluid. No biliary dilatation. Pancreas: No focal lesion. Normal pancreatic contour. No surrounding inflammatory changes. No main pancreatic ductal dilatation. Spleen: Wedge-shaped hypodensity within the splenic parenchyma measuring up to approximately 2 cm on axial imaging (3:67). Spleen is normal in size. Adrenals/Urinary Tract: No adrenal nodule bilaterally. Poorly defined approximately 3 cm on coronal imaging wedge-shaped hypodensity centered within the interpolar region of the left kidney. Otherwise bilateral kidneys enhance symmetrically. Round subcentimeter hypodensity within left kidney and right kidney too small to characterize. No hydronephrosis. No hydroureter. The urinary bladder is distended with fluid. Stomach/Bowel: Stomach is within normal limits. No evidence of bowel wall thickening or dilatation. Appendix appears normal. Vascular/Lymphatic: No abdominal aorta or iliac aneurysm. Severe atherosclerotic plaque of the aorta and its branches. No abdominal, pelvic, or inguinal lymphadenopathy. Reproductive: The prostate is enlarged measuring up to 5 cm. Other: No intraperitoneal free fluid. No intraperitoneal free gas. No organized fluid collection.  Musculoskeletal: At least moderate volume bilateral inguinal hernias containing a loop of large bowel on the left and a loop of small bowel on the right. The mesentery just anterior to the entrance site of the right inguinal hernia is noted to swirl. No suspicious lytic or blastic osseous lesions. No acute displaced fracture. Multilevel degenerative  changes of the spine. IMPRESSION: 1. Limited evaluation due to respiratory motion artifact. 2. Right lower lobe subsegmental atelectasis. Superimposed infection/inflammation not excluded. 3. Wedge-shaped hypodensities within the spleen and left kidney suggestive of infarctions. Differential diagnosis for left kidney includes focal pyelonephritis. Recommend correlation with urinalysis. 4. At least moderate volume bilateral inguinal hernias containing a loop of large bowel on the left and a loop of small bowel on the right. No CT findings to suggest associated bowel ischemia or obstruction. 5. Prostatomegaly with the urinary bladder distended with urine. 6. Aortic Atherosclerosis (ICD10-I70.0) and Emphysema (ICD10-J43.9). Electronically Signed   By: Iven Finn M.D.   On: 10/09/2021 16:17   DG CHEST PORT 1 VIEW  Result Date: 10/09/2021 CLINICAL DATA:  Endotracheal tube placement post cardiac arrest. EXAM: PORTABLE CHEST 1 VIEW COMPARISON:  Radiograph yesterday.  CT earlier today. FINDINGS: Endotracheal tube tip 3.8 cm from the carina. Increasing atelectasis at the right lung base. No pneumothorax or large pleural effusion. No pulmonary edema. Normal heart size with stable mediastinal contours. On limited evaluation, no acute osseous abnormalities are seen. IMPRESSION: 1. Endotracheal tube tip 3.8 cm from the carina. 2. Increasing right basilar atelectasis. Electronically Signed   By: Keith Rake M.D.   On: 10/09/2021 22:16   DG Chest Portable 1 View  Result Date: 10/02/2021 CLINICAL DATA:  Hematuria, weakness EXAM: PORTABLE CHEST 1 VIEW COMPARISON:   09/30/2021 FINDINGS: Stable cardiomediastinal contours. Slightly decreased lung volumes. No focal airspace consolidation, pleural effusion, or pneumothorax. IMPRESSION: No active disease. Electronically Signed   By: Davina Poke D.O.   On: 10/09/2021 13:37   ECHOCARDIOGRAM COMPLETE  Result Date: 10/09/2021    ECHOCARDIOGRAM REPORT   Patient Name:   Lance Spears Date of Exam: 10/09/2021 Medical Rec #:  [id number]   Height:       69.0 in Accession #:    [id number]  Weight:       149.3 lb Date of Birth:  February 27, 1961   BSA:          1.824 m Patient Age:    37 years    BP:           128/76 mmHg Patient Gender: M           HR:           82 bpm. Exam Location:  Inpatient Procedure: 2D Echo Indications:    stroke  History:        Patient has no prior history of Echocardiogram examinations.  Sonographer:    Johny Chess RDCS Referring Phys: [id number] Lequita Halt  Sonographer Comments: Image acquisition challenging due to respiratory motion. IMPRESSIONS  1. Thickened MV with prolapse of posterior MV leaflet; eccentric, anteriorly directed MR difficult to quantitate but possibly severe; suggest TEE to further assess.  2. Left ventricular ejection fraction, by estimation, is 60 to 65%. The left ventricle has normal function. The left ventricle has no regional wall motion abnormalities. Left ventricular diastolic parameters were normal.  3. Right ventricular systolic function is normal. The right ventricular size is normal.  4. Left atrial size was moderately dilated.  5. The mitral valve is myxomatous. Severe mitral valve regurgitation. No evidence of mitral stenosis. There is moderate holosystolic prolapse of of the mitral valve.  6. The aortic valve is normal in structure. Aortic valve regurgitation is not visualized. No aortic stenosis is present.  7. Aortic dilatation noted. There is borderline dilatation of the ascending aorta, measuring 38 mm. Comparison(s): No prior Echocardiogram.  FINDINGS  Left Ventricle:  Left ventricular ejection fraction, by estimation, is 60 to 65%. The left ventricle has normal function. The left ventricle has no regional wall motion abnormalities. The left ventricular internal cavity size was normal in size. There is  no left ventricular hypertrophy. Left ventricular diastolic parameters were normal. Right Ventricle: The right ventricular size is normal. Right ventricular systolic function is normal. Left Atrium: Left atrial size was moderately dilated. Right Atrium: Right atrial size was normal in size. Pericardium: Trivial pericardial effusion is present. Mitral Valve: The mitral valve is myxomatous. There is moderate holosystolic prolapse of of the mitral valve. There is moderate thickening of the mitral valve leaflet(s). Severe mitral valve regurgitation. No evidence of mitral valve stenosis. Tricuspid Valve: The tricuspid valve is normal in structure. Tricuspid valve regurgitation is trivial. No evidence of tricuspid stenosis. Aortic Valve: The aortic valve is normal in structure. Aortic valve regurgitation is not visualized. No aortic stenosis is present. Pulmonic Valve: The pulmonic valve was normal in structure. Pulmonic valve regurgitation is not visualized. No evidence of pulmonic stenosis. Aorta: Aortic dilatation noted. There is borderline dilatation of the ascending aorta, measuring 38 mm. Venous: The inferior vena cava was not well visualized. IAS/Shunts: The interatrial septum was not well visualized. Additional Comments: Thickened MV with prolapse of posterior MV leaflet; eccentric, anteriorly directed MR difficult to quantitate but possibly severe; suggest TEE to further assess.  LEFT VENTRICLE PLAX 2D LVIDd:         4.60 cm   Diastology LVIDs:         3.10 cm   LV e' medial:    11.70 cm/s LV PW:         1.10 cm   LV E/e' medial:  10.2 LV IVS:        0.90 cm   LV e' lateral:   16.10 cm/s LVOT diam:     2.10 cm   LV E/e' lateral: 7.4 LVOT Area:     3.46 cm  LEFT ATRIUM              Index        RIGHT ATRIUM           Index LA diam:        4.10 cm 2.25 cm/m   RA Area:     12.00 cm LA Vol (A2C):   89.4 ml 49.00 ml/m  RA Volume:   25.10 ml  13.76 ml/m LA Vol (A4C):   65.8 ml 36.07 ml/m LA Biplane Vol: 79.4 ml 43.52 ml/m   AORTA Ao Root diam: 3.50 cm Ao Asc diam:  3.80 cm MITRAL VALVE MV Area (PHT): 3.85 cm     SHUNTS MV Decel Time: 197 msec     Systemic Diam: 2.10 cm MV E velocity: 119.00 cm/s MV A velocity: 76.40 cm/s MV E/A ratio:  1.56 Kirk Ruths MD Electronically signed by Kirk Ruths MD Signature Date/Time: 10/09/2021/1:35:41 PM    Final    VAS [place] LOWER EXTREMITY VENOUS (DVT)  Result Date: November 05, 2021  Lower Venous DVT Study Patient Name:  Lance Spears  Date of Exam:   10/09/2021 Medical Rec #: [id number]    Accession #:    [id number] Date of Birth: 06-24-1961    Patient Gender: M Patient Age:   36 years Exam Location:  [facility] Procedure:      VAS [place] LOWER EXTREMITY VENOUS (DVT) Referring Phys: Cornelius Moras XU --------------------------------------------------------------------------------  Indications: Stroke.  Comparison Study: No prior studies.  Performing Technologist: Jean Rosenthal RDMS, RVT  Examination Guidelines: A complete evaluation includes B-mode imaging, spectral Doppler, color Doppler, and power Doppler as needed of all accessible portions of each vessel. Bilateral testing is considered an integral part of a complete examination. Limited examinations for reoccurring indications may be performed as noted. The reflux portion of the exam is performed with the patient in reverse Trendelenburg.  +---------+---------------+---------+-----------+----------+--------------+ RIGHT    CompressibilityPhasicitySpontaneityPropertiesThrombus Aging +---------+---------------+---------+-----------+----------+--------------+ CFV      Full           Yes      Yes                                  +---------+---------------+---------+-----------+----------+--------------+ SFJ      Full                                                        +---------+---------------+---------+-----------+----------+--------------+ FV Prox  Full                                                        +---------+---------------+---------+-----------+----------+--------------+ FV Mid   Full                                                        +---------+---------------+---------+-----------+----------+--------------+ FV DistalFull                                                        +---------+---------------+---------+-----------+----------+--------------+ PFV      Full                                                        +---------+---------------+---------+-----------+----------+--------------+ POP      Full           Yes      Yes                                 +---------+---------------+---------+-----------+----------+--------------+ PTV      Full                                                        +---------+---------------+---------+-----------+----------+--------------+ PERO     Full                                                        +---------+---------------+---------+-----------+----------+--------------+  Gastroc  Full                                                        +---------+---------------+---------+-----------+----------+--------------+   +---------+---------------+---------+-----------+----------+--------------+ LEFT     CompressibilityPhasicitySpontaneityPropertiesThrombus Aging +---------+---------------+---------+-----------+----------+--------------+ CFV      Full           Yes      Yes                                 +---------+---------------+---------+-----------+----------+--------------+ SFJ      Full                                                         +---------+---------------+---------+-----------+----------+--------------+ FV Prox  Full                                                        +---------+---------------+---------+-----------+----------+--------------+ FV Mid   Full                                                        +---------+---------------+---------+-----------+----------+--------------+ FV DistalFull                                                        +---------+---------------+---------+-----------+----------+--------------+ PFV      Full                                                        +---------+---------------+---------+-----------+----------+--------------+ POP      Full           Yes      Yes                                 +---------+---------------+---------+-----------+----------+--------------+ PTV      Full                                                        +---------+---------------+---------+-----------+----------+--------------+ PERO     Full                                                        +---------+---------------+---------+-----------+----------+--------------+  Gastroc  Full                                                        +---------+---------------+---------+-----------+----------+--------------+     Summary: RIGHT: - There is no evidence of deep vein thrombosis in the lower extremity.  - No cystic structure found in the popliteal fossa.  LEFT: - There is no evidence of deep vein thrombosis in the lower extremity.  - No cystic structure found in the popliteal fossa.  *See table(s) above for measurements and observations. Electronically signed by Deitra Mayo MD on 19-Oct-2021 at 5:14:24 AM.    Final     Microbiology Recent Results (from the past 240 hour(s))  MRSA Next Gen by PCR, Nasal     Status: None   Collection Time: 10/09/21 10:16 PM   Specimen: Nasal Mucosa; Nasal Swab  Result Value Ref Range Status   MRSA by PCR Next  Gen NOT DETECTED NOT DETECTED Final    Comment: (NOTE) The GeneXpert MRSA Assay (FDA approved for NASAL specimens only), is one component of a comprehensive MRSA colonization surveillance program. It is not intended to diagnose MRSA infection nor to guide or monitor treatment for MRSA infections. Test performance is not FDA approved in patients less than 17 years old. Performed at [facility], [place] [address], [place], [place] [address]     Lab Basic Metabolic Panel: No results for input(s): NA, K, CL, CO2, GLUCOSE, BUN, CREATININE, CALCIUM, MG, PHOS in the last 168 hours. Liver Function Tests: No results for input(s): AST, ALT, ALKPHOS, BILITOT, PROT, ALBUMIN in the last 168 hours. No results for input(s): LIPASE, AMYLASE in the last 168 hours. No results for input(s): AMMONIA in the last 168 hours. CBC: No results for input(s): WBC, NEUTROABS, HGB, HCT, MCV, PLT in the last 168 hours. Cardiac Enzymes: No results for input(s): CKTOTAL, CKMB, CKMBINDEX, TROPONINI in the last 168 hours. Sepsis Labs: No results for input(s): PROCALCITON, WBC, LATICACIDVEN in the last 168 hours.  Procedures/Operations  ETT placement     Rastus Borton L Landry Kamath 10/19/2021, 12:12 PM

## 2021-10-20 NOTE — Procedures (Signed)
Extubation Procedure Note  Patient Details:   Name: Lance Spears DOB: Jun 28, 1961 MRN: [id number]   Airway Documentation:  Airway 7.5 mm (Active)  Secured at (cm) 25 cm 10/09/21 2300  Measured From Lips 10/09/21 2300  Secured Location Left 10/09/21 2300  Secured By [employer] 10/09/21 2300  Tube Holder Repositioned Yes 10/09/21 2300  Prone position No 10/09/21 2300  Cuff Pressure (cm H2O) MOV (Manual Technique) 10/09/21 2300  Site Condition Dry 10/09/21 2300   Vent end date: (not recorded) Vent end time: (not recorded)   Evaluation  O2 sats: stable throughout Complications: No apparent complications Patient did tolerate procedure well. Bilateral Breath Sounds: Diminished   No  Pt was terminally extubated with family at bedside per their decision.  Davina Poke Millan Legan 09/28/2021, 4:09 AM

## 2021-10-20 NOTE — Progress Notes (Signed)
Pt's wife called out reporting that her husband was not breathing. Charge nurse went into the room to check on patient and [facility] called this primary care nurse who was in another patient's room. Care nurse followed right behind charge nurse to check on patient.   Upon arrival to the room. Patient appeared to be not breathing, diaphoretic, and charge nurse unable to palpate pulse.   Compression started by charge and code blue initiated. See paper document for code blue detailed documentation in the chart.   Post code: Patient intubated and transferred to 4N room 18. Report given to receiving RN.

## 2021-10-20 NOTE — Progress Notes (Signed)
eLink Physician-Brief Progress Note Patient Name: Lance Spears DOB: 10-23-1961 MRN: [id number]   Date of Service  09/29/2021  HPI/Events of Note  Spoke with patient's wife, Lance Spears, who states that given her husband's poor prognosis for meaningful neurological recovery or even survival she and her son's wish to proceed with comfort measures and removal all life sustaining support and allow her husband to pass with comfort and dignity.   eICU Interventions  Will proceed with comfort measures via the withdrawal of life sustaining support protocol.      Intervention Category Major Interventions: End of life / care limitation discussion  Lenell Antu 10/18/2021, 3:49 AM

## 2021-10-20 NOTE — Progress Notes (Signed)
PHARMACY - PHYSICIAN COMMUNICATION CRITICAL VALUE ALERT - BLOOD CULTURE IDENTIFICATION (BCID)  Lance Spears is an 60 y.o. male who presented to [facility] on 2021-10-13 with a chief complaint of fatigue/weakness   Name of physician (or Provider) Contacted: Dr. Tonia Brooms  Current antibiotics:None   Changes to prescribed antibiotics recommended:  None, pt is comfort measures only  Results for orders placed or performed during the hospital encounter of 10-13-2021  Blood Culture ID Panel (Reflexed) (Collected: 10/09/2021  5:40 AM)  Result Value Ref Range   Enterococcus faecalis NOT DETECTED NOT DETECTED   Enterococcus Faecium NOT DETECTED NOT DETECTED   Listeria monocytogenes NOT DETECTED NOT DETECTED   Staphylococcus species NOT DETECTED NOT DETECTED   Staphylococcus aureus (BCID) NOT DETECTED NOT DETECTED   Staphylococcus epidermidis NOT DETECTED NOT DETECTED   Staphylococcus lugdunensis NOT DETECTED NOT DETECTED   Streptococcus species DETECTED (A) NOT DETECTED   Streptococcus agalactiae NOT DETECTED NOT DETECTED   Streptococcus pneumoniae NOT DETECTED NOT DETECTED   Streptococcus pyogenes NOT DETECTED NOT DETECTED   A.calcoaceticus-baumannii NOT DETECTED NOT DETECTED   Bacteroides fragilis NOT DETECTED NOT DETECTED   Enterobacterales NOT DETECTED NOT DETECTED   Enterobacter cloacae complex NOT DETECTED NOT DETECTED   Escherichia coli NOT DETECTED NOT DETECTED   Klebsiella aerogenes NOT DETECTED NOT DETECTED   Klebsiella oxytoca NOT DETECTED NOT DETECTED   Klebsiella pneumoniae NOT DETECTED NOT DETECTED   Proteus species NOT DETECTED NOT DETECTED   Salmonella species NOT DETECTED NOT DETECTED   Serratia marcescens NOT DETECTED NOT DETECTED   Haemophilus influenzae NOT DETECTED NOT DETECTED   Neisseria meningitidis NOT DETECTED NOT DETECTED   Pseudomonas aeruginosa NOT DETECTED NOT DETECTED   Stenotrophomonas maltophilia NOT DETECTED NOT DETECTED   Candida albicans NOT DETECTED NOT  DETECTED   Candida auris NOT DETECTED NOT DETECTED   Candida glabrata NOT DETECTED NOT DETECTED   Candida krusei NOT DETECTED NOT DETECTED   Candida parapsilosis NOT DETECTED NOT DETECTED   Candida tropicalis NOT DETECTED NOT DETECTED   Cryptococcus neoformans/gattii NOT DETECTED NOT DETECTED    Abran Duke 10/08/2021  3:49 AM
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
## 2021-10-20 NOTE — Progress Notes (Addendum)
Wife updated at bedside of CT findings per Dr. Tonia Brooms.  DNR placed.  Continue supportive care until patient's family can arrive / visit.  PRN morphine for evidence of discomfort / distress.  May need to escalate to infusion.    Lance Brim, MSN, APRN, NP-C, AGACNP-BC  Pulmonary & Critical Care 10/06/2021, 1:31 AM   Please see [email or web address] for pager details.   From 7A-7P if no response, please call [phone] After hours, please call ELink [phone]

## 2021-10-20 DEATH — deceased
